# Patient Record
Sex: Female | Born: 1980 | Race: White | Hispanic: No | Marital: Married | Smoking: Never smoker
Health system: Southern US, Community
[De-identification: ages and names within clinical notes are randomized; demographics above are authoritative.]

## PROBLEM LIST (undated history)

## (undated) ENCOUNTER — Inpatient Hospital Stay (HOSPITAL_COMMUNITY): Payer: Self-pay

## (undated) DIAGNOSIS — F419 Anxiety disorder, unspecified: Secondary | ICD-10-CM

## (undated) DIAGNOSIS — F32A Depression, unspecified: Secondary | ICD-10-CM

## (undated) DIAGNOSIS — F329 Major depressive disorder, single episode, unspecified: Secondary | ICD-10-CM

## (undated) DIAGNOSIS — R002 Palpitations: Secondary | ICD-10-CM

## (undated) DIAGNOSIS — E039 Hypothyroidism, unspecified: Secondary | ICD-10-CM

## (undated) HISTORY — PX: OTHER SURGICAL HISTORY: SHX169

## (undated) HISTORY — PX: BUNIONECTOMY: SHX129

---

## 2011-09-13 DIAGNOSIS — N979 Female infertility, unspecified: Secondary | ICD-10-CM | POA: Insufficient documentation

## 2014-02-19 ENCOUNTER — Encounter (HOSPITAL_COMMUNITY): Payer: Self-pay | Admitting: Pharmacist

## 2014-03-05 ENCOUNTER — Ambulatory Visit (HOSPITAL_COMMUNITY): Admission: RE | Admit: 2014-03-05 | Payer: Self-pay | Source: Ambulatory Visit | Admitting: Obstetrics and Gynecology

## 2014-03-05 ENCOUNTER — Encounter (HOSPITAL_COMMUNITY): Admission: RE | Payer: Self-pay | Source: Ambulatory Visit

## 2014-03-05 SURGERY — LAPAROSCOPY, DIAGNOSTIC
Anesthesia: General

## 2014-03-27 DIAGNOSIS — Z3183 Encounter for assisted reproductive fertility procedure cycle: Secondary | ICD-10-CM | POA: Insufficient documentation

## 2015-01-29 ENCOUNTER — Ambulatory Visit (INDEPENDENT_AMBULATORY_CARE_PROVIDER_SITE_OTHER): Payer: BLUE CROSS/BLUE SHIELD | Admitting: Podiatry

## 2015-01-29 ENCOUNTER — Telehealth: Payer: Self-pay | Admitting: *Deleted

## 2015-01-29 VITALS — BP 101/47 | HR 75 | Resp 15

## 2015-01-29 DIAGNOSIS — M79671 Pain in right foot: Secondary | ICD-10-CM

## 2015-01-29 DIAGNOSIS — M79672 Pain in left foot: Secondary | ICD-10-CM

## 2015-01-29 DIAGNOSIS — Q6689 Other  specified congenital deformities of feet: Secondary | ICD-10-CM

## 2015-01-29 DIAGNOSIS — M2142 Flat foot [pes planus] (acquired), left foot: Secondary | ICD-10-CM

## 2015-01-29 DIAGNOSIS — M2141 Flat foot [pes planus] (acquired), right foot: Secondary | ICD-10-CM

## 2015-01-29 NOTE — Progress Notes (Signed)
   Subjective:    Patient ID: Cassandra Kennedy, female    DOB: April 06, 1981, 34 y.o.   MRN: [id number]  HPI 34 year old female presents the office they with her husband for surgical consultation due to bilateral flatfoot deformity. She states that she's had flatfeet her entire life and she's had pain to her feet for quite some time. She states that she has pain particularly after prolonged appears ambulation and walking. She states that she has tried over-the-counter and custom orthotics without much relief. Due to continued pain she was referred to me for surgical consultation. Left is slightly worse than the right Denies any history of injury or trauma. Denies any numbness or tingling. No other complaints at this time  Review of Systems  All other systems reviewed and are negative.      Objective:   Physical Exam AAO x3, NAD DP/PT pulses palpable bilaterally, CRT less than 3 seconds Protective sensation intact with Simms Weinstein monofilament, vibratory sensation intact, Achilles tendon reflex intact Nonweightbearing exam reveals that the ankle and subtotal joint range of motion is intact. There is no pain or crepitation with subtalar ankle joint range of motion or along the mid tarsal or MTPJ. There is equinus present. Upon weightbearing evaluation there is a decrease in medial arch height and forefoot adduction and calcaneal valgus. Upon gait evaluation there is excessive pronation without any resupination gait. There is no areas of pinpoint bony tenderness or pain with vibratory sensation. Subjectively there is tenderness on this lateral aspect the left foot over the sinus tarsi more than the right. She is able to perform acetyl and double heel rise without any discomfort. There is no pain on the course of the posterior tibial tendon or along the insertion of the navicular tuberosity. No other areas of tenderness to bilateral lower extremities. MMT 5/5, ROM WNL.  No open lesions or pre-ulcerative  lesions.  No overlying edema, erythema, increase in warmth to bilateral lower extremities.  No pain with calf compression, swelling, warmth, erythema bilaterally.      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  -Treatment options discussed including all alternatives, risks, and complications -I discussed both conservative and surgical options the patient. At this time she has attempted conservative treatment without any resolution she is inquiring about possible surgical intervention. I discussed with her flatfoot reconstruction including medial calcaneal slide osteotomy, Evans calcaneal osteotomy, Cotton osteotomy and heel cord lengthening. She was inquiring about a STJ implant. I discussed the indications for the implant and is typically indicated children although it can be done in adults but I believe the results are not as good.  -I would like to obtain a CT scan without contrast to the left foot to evaluate the integrity of the joints to determine definitve surgical procedures. Also, rule out tarsal coalition.  -Follow-up after CT scan or sooner if any problems are to arise. In the meantime, encouraged to call the office with any questions/concerns/change in symptoms.

## 2015-01-29 NOTE — Telephone Encounter (Signed)
Dr. Ardelle Anton ordered CT Scan of left foot without contrast r/o tarsal coalition vs sub-talar joint arthritis.

## 2015-01-30 ENCOUNTER — Telehealth: Payer: Self-pay | Admitting: *Deleted

## 2015-01-30 ENCOUNTER — Encounter: Payer: Self-pay | Admitting: *Deleted

## 2015-01-30 ENCOUNTER — Other Ambulatory Visit: Payer: Self-pay | Admitting: Podiatry

## 2015-01-30 DIAGNOSIS — M79672 Pain in left foot: Secondary | ICD-10-CM

## 2015-01-30 DIAGNOSIS — Q6689 Other  specified congenital deformities of feet: Secondary | ICD-10-CM

## 2015-01-30 NOTE — Telephone Encounter (Signed)
Cancelled order - CT 3D Recon at [facility] ([id number]).  [facility] states proper order for CT of foot without contrast is [id number], and the receptionist there said she had changed to proper orders.

## 2015-04-09 NOTE — Progress Notes (Signed)
Prior authorization [employer] orders# [id number].

## 2015-06-21 ENCOUNTER — Encounter (HOSPITAL_COMMUNITY): Payer: Self-pay

## 2015-06-21 ENCOUNTER — Emergency Department (HOSPITAL_COMMUNITY)
Admission: EM | Admit: 2015-06-21 | Discharge: 2015-06-21 | Disposition: A | Discharge status: Home / Self Care | Payer: BLUE CROSS/BLUE SHIELD | Attending: Emergency Medicine | Admitting: Emergency Medicine

## 2015-06-21 DIAGNOSIS — Y998 Other external cause status: Secondary | ICD-10-CM | POA: Diagnosis not present

## 2015-06-21 DIAGNOSIS — S51852A Open bite of left forearm, initial encounter: Secondary | ICD-10-CM | POA: Diagnosis present

## 2015-06-21 DIAGNOSIS — Z23 Encounter for immunization: Secondary | ICD-10-CM | POA: Diagnosis not present

## 2015-06-21 DIAGNOSIS — Y9289 Other specified places as the place of occurrence of the external cause: Secondary | ICD-10-CM | POA: Insufficient documentation

## 2015-06-21 DIAGNOSIS — W540XXA Bitten by dog, initial encounter: Secondary | ICD-10-CM | POA: Insufficient documentation

## 2015-06-21 DIAGNOSIS — Y9389 Activity, other specified: Secondary | ICD-10-CM | POA: Diagnosis not present

## 2015-06-21 MED ORDER — AMOXICILLIN-POT CLAVULANATE 875-125 MG PO TABS
1.0000 | ORAL_TABLET | Freq: Once | ORAL | Status: AC
  Administered 2015-06-21: 1 via ORAL
  Filled 2015-06-21: qty 1

## 2015-06-21 MED ORDER — TETANUS-DIPHTH-ACELL PERTUSSIS 5-2.5-18.5 LF-MCG/0.5 IM SUSP
0.5000 mL | Freq: Once | INTRAMUSCULAR | Status: AC
  Administered 2015-06-21: 0.5 mL via INTRAMUSCULAR
  Filled 2015-06-21: qty 0.5

## 2015-06-21 MED ORDER — AMOXICILLIN-POT CLAVULANATE 875-125 MG PO TABS: 1.0000 | ORAL_TABLET | Freq: Two times a day (BID) | ORAL | Status: DC

## 2015-06-21 MED ORDER — FENTANYL CITRATE (PF) 100 MCG/2ML IJ SOLN
100.0000 ug | Freq: Once | INTRAMUSCULAR | Status: AC
  Administered 2015-06-21: 100 ug via INTRAVENOUS
  Filled 2015-06-21: qty 2

## 2015-06-21 MED ORDER — BACITRACIN ZINC 500 UNIT/GM EX OINT
TOPICAL_OINTMENT | CUTANEOUS | Status: AC
  Administered 2015-06-21: 2
  Filled 2015-06-21: qty 1.8

## 2015-06-21 MED ORDER — HYDROMORPHONE HCL 1 MG/ML IJ SOLN
1.0000 mg | Freq: Once | INTRAMUSCULAR | Status: AC
  Administered 2015-06-21: 1 mg via INTRAVENOUS
  Filled 2015-06-21: qty 1

## 2015-06-21 MED ORDER — LIDOCAINE-EPINEPHRINE 2 %-1:100000 IJ SOLN
20.0000 mL | Freq: Once | INTRAMUSCULAR | Status: AC
  Administered 2015-06-21: 20 mL
  Filled 2015-06-21: qty 1

## 2015-06-21 MED ORDER — HYDROCODONE-ACETAMINOPHEN 5-325 MG PO TABS: 2.0000 | ORAL_TABLET | ORAL | Status: DC | PRN

## 2015-06-21 NOTE — ED Provider Notes (Signed)
CSN: [id number]     Arrival date & time 06/21/15  1846 History   First MD Initiated Contact with Patient 06/21/15 1849     Chief Complaint  Patient presents with  . Animal Bite     (Consider location/radiation/quality/duration/timing/severity/associated sxs/prior Treatment) HPI Cassandra Kennedy is a 34 y.o. female comes in for evaluation after an light. Patient is accompanied by husband. They report presently 45 minutes ago they were taking a stray dog too [place] [employer] to see if it had a tracking chip. When patient approach dog it bit her on the left arm. Dog's owner arrived and reports now is up to date on vaccines. Patient denies any numbness or weakness and is able to move all joints and fingers distal to injury. She did not clean or wash injury. Certain hand movements worsen symptoms. She rates pain as severe. She is unsure of last tetanus.  History reviewed. No pertinent past medical history. History reviewed. No pertinent past surgical history. History reviewed. No pertinent family history. Social History  Substance Use Topics  . Smoking status: None  . Smokeless tobacco: None  . Alcohol Use: None   OB History    No data available     Review of Systems A 10 point review of systems was completed and was negative except for pertinent positives and negatives as mentioned in the history of present illness     Allergies  Metronidazole  Home Medications   Prior to Admission medications   Medication Sig Start Date End Date Taking? Authorizing Provider  levothyroxine (SYNTHROID, LEVOTHROID) 100 MCG tablet Take 100 mcg by mouth daily before breakfast.   Yes Historical Provider, MD  amoxicillin-clavulanate (AUGMENTIN) 875-125 MG tablet Take 1 tablet by mouth every 12 (twelve) hours. 06/21/15   Joycie Peek, PA-C  HYDROcodone-acetaminophen (NORCO) 5-325 MG tablet Take 2 tablets by mouth every 4 (four) hours as needed. 06/21/15   Joycie Peek, PA-C   BP 116/71 mmHg  Pulse 111   Resp 20  Wt 118 lb (53.524 kg)  SpO2 100% Physical Exam  Constitutional: She is oriented to person, place, and time. She appears well-developed and well-nourished.  HENT:  Head: Normocephalic and atraumatic.  Mouth/Throat: Oropharynx is clear and moist.  Eyes: Conjunctivae are normal. Pupils are equal, round, and reactive to light. Right eye exhibits no discharge. Left eye exhibits no discharge. No scleral icterus.  Neck: Neck supple.  Cardiovascular: Normal rate, regular rhythm and normal heart sounds.   Pulmonary/Chest: Effort normal and breath sounds normal. No respiratory distress. She has no wheezes. She has no rales.  Abdominal: Soft. There is no tenderness.  Musculoskeletal: She exhibits no tenderness.  Dog bite to dorsum of left mid forearm. Approximately 4 cm in length, oblique. There is exposure of tendon sheath but no apparent tendon sheath penetration. Patient is able to flex and extend all joints distal to the injury against resistance. 2 satellite puncture wounds on the ventral aspect opposite laceration.  Neurological: She is alert and oriented to person, place, and time.  Cranial Nerves II-XII grossly intact. Motor strength is 5/5 in all joints distal to injury. Sensation is intact to light touch.  Skin: Skin is warm and dry. No rash noted.  Psychiatric: She has a normal mood and affect.  Nursing note and vitals reviewed.   ED Course  Procedures (including critical care time) Labs Review Labs Reviewed - No data to display  Imaging Review No results found. I have personally reviewed and evaluated these images and lab  results as part of my medical decision-making.   EKG Interpretation None     LACERATION REPAIR Performed by: Sharlene Mottsartner, Silveria Botz W Authorized by: Sharlene Mottsartner, Jachai Okazaki W Consent: Verbal consent obtained. Risks and benefits: risks, benefits and alternatives were discussed Consent given by: patient Patient identity confirmed: provided demographic  data Prepped and Draped in normal sterile fashion Wound explored  Laceration Location: Left forearm  Laceration Length: 4 cm  No Foreign Bodies seen or palpated  Anesthesia: local infiltration  Local anesthetic: lidocaine 2 % with epinephrine  Anesthetic total: 5 ml  Irrigation method: syringe Amount of cleaning: Copious   Skin closure: Loose closure with 4-0 Prolene   Number of sutures: 3   Technique: Simple interrupted   Patient tolerance: Patient tolerated the procedure well with no immediate complications.  Meds given in ED:  Medications  fentaNYL (SUBLIMAZE) injection 100 mcg (100 mcg Intravenous Given 06/21/15 1920)  Tdap (BOOSTRIX) injection 0.5 mL (0.5 mLs Intramuscular Given 06/21/15 1941)  HYDROmorphone (DILAUDID) injection 1 mg (1 mg Intravenous Given 06/21/15 2001)  lidocaine-EPINEPHrine (XYLOCAINE W/EPI) 2 %-1:100000 (with pres) injection 20 mL (20 mLs Infiltration Given 06/21/15 2215)  amoxicillin-clavulanate (AUGMENTIN) 875-125 MG per tablet 1 tablet (1 tablet Oral Given 06/21/15 2051)  HYDROmorphone (DILAUDID) injection 1 mg (1 mg Intravenous Given 06/21/15 2052)  bacitracin 500 UNIT/GM ointment (2 application  Given 06/21/15 2215)  HYDROmorphone (DILAUDID) injection 1 mg (1 mg Intravenous Given 06/21/15 2219)    Discharge Medication List as of 06/21/2015 10:02 PM    START taking these medications   Details  amoxicillin-clavulanate (AUGMENTIN) 875-125 MG tablet Take 1 tablet by mouth every 12 (twelve) hours., Starting 06/21/2015, Until Discontinued, Print    HYDROcodone-acetaminophen (NORCO) 5-325 MG tablet Take 2 tablets by mouth every 4 (four) hours as needed., Starting 06/21/2015, Until Discontinued, Print       Filed Vitals:   06/21/15 1850 06/21/15 1859 06/21/15 2217  BP:  133/93 116/71  Pulse:  102 111  Resp:  20 20  Weight:  118 lb (53.524 kg)   SpO2: 100% 100% 100%    MDM  Cassandra Kennedy is a 34 y.o. female with no significant past medical  history comes in for evaluation after dog bite. The dog is known and reportedly up-to-date on vaccinations. Patient's tetanus was updated in the ED. Given Augmentin in the ED. After copious irrigation, Laceration was loosely closed due to gaping appearance. Discussed patient will need to return in 48 hours for wound recheck, sooner for other concerning symptoms including fever, increased redness or swelling, increased pain or purulent discharge, decreased range of motion. Discharged home with short course pain medicines, Augmentin, dressing materials. She remains hemodynamically stable. Suspect tachycardia due to pain. Prior to patient discharge, I discussed and reviewed this case with Dr. Adriana Simasook, who also saw and evaluated the patient and agrees with the plan.   Final diagnoses:  Dog bite        Joycie PeekBenjamin Oshua Mcconaha, PA-C 06/22/15 1642  Donnetta HutchingBrian Cook, MD 06/22/15 [phone]

## 2015-06-21 NOTE — Discharge Instructions (Signed)
Please take your antibiotics as prescribed. Change your dressing as needed and keep it clean. Take your pain medicine as needed for severe pain. Do not take this medicine will driving or operating machinery. Return to ED or your PCP in 2 days for wound recheck. Return sooner for worsening symptoms including fevers, chills, increased redness, cloudy drainage from your arm or increased pain with movement.

## 2015-06-21 NOTE — ED Notes (Signed)
Bed: [id number] Expected date:  Expected time:  Means of arrival:  Comments: Dog bite

## 2015-06-21 NOTE — ED Notes (Signed)
BIB EMS. Pt found stray dog and was trying to take it to [place] [employer] to see if it had a chip. When she bet down and approached the dog, she was bitten on her left arm.Per EMS lac on top of left arm, 2 anterior punture wounds. Owners of the dog met with patient and state that the dog's rabies vaccination is UTD. The owners were filing with police when the pt was brought here. 50 fentanyl given on truck.

## 2015-06-23 ENCOUNTER — Emergency Department (HOSPITAL_COMMUNITY)
Admission: EM | Admit: 2015-06-23 | Discharge: 2015-06-23 | Disposition: A | Discharge status: Home / Self Care | Payer: BLUE CROSS/BLUE SHIELD | Attending: Emergency Medicine | Admitting: Emergency Medicine

## 2015-06-23 ENCOUNTER — Encounter (HOSPITAL_COMMUNITY): Payer: Self-pay | Admitting: Emergency Medicine

## 2015-06-23 DIAGNOSIS — Z79899 Other long term (current) drug therapy: Secondary | ICD-10-CM | POA: Diagnosis not present

## 2015-06-23 DIAGNOSIS — Z4801 Encounter for change or removal of surgical wound dressing: Secondary | ICD-10-CM | POA: Diagnosis not present

## 2015-06-23 DIAGNOSIS — Z5189 Encounter for other specified aftercare: Secondary | ICD-10-CM

## 2015-06-23 MED ORDER — BACITRACIN ZINC 500 UNIT/GM EX OINT
TOPICAL_OINTMENT | CUTANEOUS | Status: AC
  Administered 2015-06-23: 1
  Filled 2015-06-23: qty 0.9

## 2015-06-23 NOTE — ED Provider Notes (Signed)
CSN: [id number]     Arrival date & time 06/23/15  1601 History  By signing my name below, I, Cassandra Kennedy, attest that this documentation has been prepared under the direction and in the presence of [employer], PA-C. Electronically Signed: Octavia Kennedy, ED Scribe. 06/23/2015. 6:20 PM.    Chief Complaint  Patient presents with  . Wound Check      The history is provided by the patient. No language interpreter was used.   HPI Comments: Cassandra Kennedy is a 34 y.o. female who presents to the Emergency Department presenting with a wound check. Pt reports she was attacked by a pit bull on Saturday and received a deep laceration and multiple puncture wounds on her left forearm. Patient was seen in the emergency department for initial wound care 2 days ago. 4 sutures were placed on her left forearm. Skin was not approximated as the laceration was caused by a dog bite. Pt was told to return 48 hours after incident to be reevaluated. Pt is currently taking amoxicillin and has been following the correct wound care with changing her dressing. Pt denies fevers and chills, redness, swelling or drainage around wound.   History reviewed. No pertinent past medical history. History reviewed. No pertinent past surgical history. No family history on file. Social History  Substance Use Topics  . Smoking status: Never Smoker   . Smokeless tobacco: None  . Alcohol Use: None   OB History    No data available     Review of Systems  Constitutional: Negative for fever and chills.  Skin: Positive for wound.  All other systems reviewed and are negative.     Allergies  Metronidazole  Home Medications   Prior to Admission medications   Medication Sig Start Date End Date Taking? Authorizing Provider  amoxicillin-clavulanate (AUGMENTIN) 875-125 MG tablet Take 1 tablet by mouth every 12 (twelve) hours. 06/21/15   Joycie Peek, PA-C  HYDROcodone-acetaminophen (NORCO) 5-325 MG tablet Take 2  tablets by mouth every 4 (four) hours as needed. 06/21/15   Joycie Peek, PA-C  levothyroxine (SYNTHROID, LEVOTHROID) 100 MCG tablet Take 100 mcg by mouth daily before breakfast.    Historical Provider, MD   Triage vitals: BP 138/104 mmHg  Pulse 87  Temp(Src) 98.2 F (36.8 C) (Oral)  SpO2 100%  LMP 06/16/2015 Physical Exam  Constitutional: She is oriented to person, place, and time. She appears well-developed and well-nourished. No distress.  HENT:  Head: Normocephalic and atraumatic.  Mouth/Throat: No oropharyngeal exudate.  Eyes: Conjunctivae are normal. Right eye exhibits no discharge. Left eye exhibits no discharge. No scleral icterus.  Cardiovascular: Normal rate and intact distal pulses.   Pulmonary/Chest: Effort normal.  Musculoskeletal: Normal range of motion. She exhibits no edema.  Neurological: She is alert and oriented to person, place, and time. No cranial nerve deficit.  Strength 5/5 throughout. No sensory deficits.    Skin: Skin is warm and dry. No rash noted. She is not diaphoretic. No erythema. No pallor.  4 cm laceration to the dorsal aspect of left mid forearm. 4 sutures in place. Skin not approximated. Multiple additional puncture wounds on the volar aspect of mid forearm. No sutures placed here. No sign of infection. No drainage, redness, swelling. Wounds clean dry and intact.  Patient able to flex and extend all joints distal to the injury. Sensation intact.  Psychiatric: She has a normal mood and affect. Her behavior is normal.  Nursing note and vitals reviewed.   ED Course  Procedures  DIAGNOSTIC STUDIES: Oxygen Saturation is 100% on RA, normal by my interpretation.  COORDINATION OF CARE:  7:10 PM Discussed treatment plan which includes change the dressing with pt at bedside and pt agreed to plan.  Labs Review Labs Reviewed - No data to display  Imaging Review No results found. I have personally reviewed and evaluated these images and lab results  as part of my medical decision-making.   EKG Interpretation None      MDM   Final diagnoses:  Visit for wound check    Healthy 34 year old female presents for wound check. Patient was attacked by a pit bull 2 days ago. Was seen in the emergency department at that time. Patient has large 4 cm laceration to dorsal aspect of mid forearm. 4 sutures in place. Skin not approximated to decrease risk of infection as laceration was caused by dogbite. Dog has had rabies vaccinations. Patient up-to-date on her tetanus. Wounds appear to be healing fine. No sign of infection. Patient is currently taking amoxicillin. Patient is to return in 7 days for suture removal. Discussed with patient to look for signs of infection and return to the emergency department if she experiences any. Wound dressing changed today. Patient stable for discharge. Return precautions outlined in patient discharge instructions. I personally performed the services described in this documentation, which was scribed in my presence. The recorded information has been reviewed and is accurate.    Cassandra KinsmanSamantha Kennedy Hampton ManorDowless, PA-C 06/25/15 1553  Cassandra Boozeavid Glick, MD 06/25/15 [phone]

## 2015-06-23 NOTE — ED Notes (Addendum)
Patient states bitten on left arm Saturday, seen here and received sutures, told to return in 48 hours for wound recheck. Denies any new symptoms. Denies signs of infection. 1000mg  tylenol approximately 1430.

## 2015-06-23 NOTE — Discharge Instructions (Signed)
Wound Care Taking care of your wound properly can help to prevent pain and infection. It can also help your wound to heal more quickly.  HOW TO CARE FOR YOUR WOUND  Take or apply over-the-counter and prescription medicines only as told by your health care provider.  If you were prescribed antibiotic medicine, take or apply it as told by your health care provider. Do not stop using the antibiotic even if your condition improves.  Clean the wound each day or as told by your health care provider.  Wash the wound with mild soap and water.  Rinse the wound with water to remove all soap.  Pat the wound dry with a clean towel. Do not rub it.  There are many different ways to close and cover a wound. For example, a wound can be covered with stitches (sutures), skin glue, or adhesive strips. Follow instructions from your health care provider about:  How to take care of your wound.  When and how you should change your bandage (dressing).  When you should remove your dressing.  Removing whatever was used to close your wound.  Check your wound every day for signs of infection. Watch for:  Redness, swelling, or pain.  Fluid, blood, or pus.  Keep the dressing dry until your health care provider says it can be removed. Do not take baths, swim, use a hot tub, or do anything that would put your wound underwater until your health care provider approves.  Raise (elevate) the injured area above the level of your heart while you are sitting or lying down.  Do not scratch or pick at the wound.  Keep all follow-up visits as told by your health care provider. This is important. SEEK MEDICAL CARE IF:  You received a tetanus shot and you have swelling, severe pain, redness, or bleeding at the injection site.  You have a fever.  Your pain is not controlled with medicine.  You have increased redness, swelling, or pain at the site of your wound.  You have fluid, blood, or pus coming from your  wound.  You notice a bad smell coming from your wound or your dressing. SEEK IMMEDIATE MEDICAL CARE IF:  You have a red streak going away from your wound.   This information is not intended to replace advice given to you by your health care provider. Make sure you discuss any questions you have with your health care provider.   Return to the emergency department in 7 days for suture removal. Return to the emergency department sooner if you experience signs of infection, redness, swelling, drainage from the infected area, fever. Continue taking antibiotics as prescribed. Continue washing affected area with soap and water.

## 2016-07-15 DIAGNOSIS — Q6652 Congenital pes planus, left foot: Secondary | ICD-10-CM | POA: Insufficient documentation

## 2016-07-15 DIAGNOSIS — Q6689 Other  specified congenital deformities of feet: Secondary | ICD-10-CM | POA: Insufficient documentation

## 2016-07-15 DIAGNOSIS — M21862 Other specified acquired deformities of left lower leg: Secondary | ICD-10-CM | POA: Insufficient documentation

## 2016-07-15 DIAGNOSIS — Q6651 Congenital pes planus, right foot: Secondary | ICD-10-CM | POA: Insufficient documentation

## 2016-07-30 ENCOUNTER — Ambulatory Visit (INDEPENDENT_AMBULATORY_CARE_PROVIDER_SITE_OTHER): Payer: BLUE CROSS/BLUE SHIELD | Admitting: Podiatry

## 2016-07-30 ENCOUNTER — Ambulatory Visit (INDEPENDENT_AMBULATORY_CARE_PROVIDER_SITE_OTHER): Payer: BLUE CROSS/BLUE SHIELD

## 2016-07-30 DIAGNOSIS — Q6689 Other  specified congenital deformities of feet: Secondary | ICD-10-CM | POA: Diagnosis not present

## 2016-07-30 DIAGNOSIS — S92911A Unspecified fracture of right toe(s), initial encounter for closed fracture: Secondary | ICD-10-CM

## 2016-07-30 DIAGNOSIS — M79671 Pain in right foot: Secondary | ICD-10-CM

## 2016-08-11 NOTE — Progress Notes (Signed)
Subjective: 35 year old female presents the office they with her husband for concerns of flatfoot deformity. After I last saw her last year she has seen 2 other physicians for this for different opinions. At this point she states that she'll to proceed with surgical invention. She's tried change of shoes, orthotics, bracing without any significant symptoms. She also brought an MRI for review. Denies any systemic complaints such as fevers, chills, nausea, vomiting. No acute changes since last appointment, and no other complaints at this time.   Objective: AAO x3, NAD DP/PT pulses palpable bilaterally, CRT less than 3 seconds There is a decrease in medial arch height upon weightbearing. There is decreased range of motion the subtalar joint and left foot and how there is no pain with ROM. Upon putting her heel in a rectus position, it appears that the her forefoot is in a varus position. There is also similar deformity on the right foot.  No open lesions.  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 She had a previous satisfaction this appears to be doing well.   I discussed both conservative and surgical treatment options with the patient. At this point she wishes to proceed with surgical intervention as she is attended multiple conservative translation and resolution. -I long discussion with the patient and her husband in regards to various treatment options as far surgery. She is a very difficult foot type. I discussed her joint sparing versus arthrodesis procedures. I discussed with him and it discussed with other physicians about various  surgical procedures. I'll see her back next several weeks and we will further discuss this.  -Patient encouraged to call the office with any questions, concerns, change in symptoms.   Ovid CurdMatthew Wagoner, DPM

## 2016-08-18 ENCOUNTER — Telehealth: Payer: Self-pay | Admitting: *Deleted

## 2016-08-18 NOTE — Telephone Encounter (Signed)
Called patient and left a message to see how patient was doing and I stated to call our [place] office. Misty StanleyLisa

## 2016-08-23 ENCOUNTER — Encounter: Payer: Self-pay | Admitting: Podiatry

## 2016-08-23 ENCOUNTER — Ambulatory Visit (INDEPENDENT_AMBULATORY_CARE_PROVIDER_SITE_OTHER): Payer: BLUE CROSS/BLUE SHIELD

## 2016-08-23 ENCOUNTER — Other Ambulatory Visit: Payer: Self-pay | Admitting: Podiatry

## 2016-08-23 ENCOUNTER — Ambulatory Visit (INDEPENDENT_AMBULATORY_CARE_PROVIDER_SITE_OTHER): Payer: BLUE CROSS/BLUE SHIELD | Admitting: Podiatry

## 2016-08-23 DIAGNOSIS — M2141 Flat foot [pes planus] (acquired), right foot: Secondary | ICD-10-CM

## 2016-08-23 DIAGNOSIS — M779 Enthesopathy, unspecified: Secondary | ICD-10-CM

## 2016-08-23 DIAGNOSIS — M21961 Unspecified acquired deformity of right lower leg: Secondary | ICD-10-CM | POA: Diagnosis not present

## 2016-08-23 DIAGNOSIS — Q6689 Other  specified congenital deformities of feet: Secondary | ICD-10-CM | POA: Diagnosis not present

## 2016-08-23 DIAGNOSIS — M2142 Flat foot [pes planus] (acquired), left foot: Secondary | ICD-10-CM | POA: Diagnosis not present

## 2016-08-23 NOTE — Patient Instructions (Signed)

## 2016-08-23 NOTE — Progress Notes (Signed)
Subjective: 36 year old presents the office today for follow-up evaluation of left foot pain. She says right foot is doing well she's having no pain. The previous stress fracture. She also presents a pickup orthotics discussed surgical intervention. She's had pain to her feet for several years at this point. She states that she gets pain to the heels with the arch of the foot and she also gets pain on the sinus tarsi where she points to. She has seen 2 other physicians for this in regards to surgical consultation. She presented to further discuss this as well. She has tried shoe changes, orthotics or any significant improvement. Denies any systemic complaints such as fevers, chills, nausea, vomiting. No acute changes since last appointment, and no other complaints at this time.   Objective: AAO x3, NAD DP/PT pulses palpable bilaterally, CRT less than 3 seconds There is a decrease in medial arch height upon weightbearing. There is prolonged pronation throughout gait and there is no recent supination. There is decreased motion the subtalar joint and there is tenderness in the lateral aspect of the sinus tarsi. There is no pain or resection ankle joint range of motion. Upon weightbearing and I put her heel into a neutral position she has a significant forefoot supination. There is no area pinpoint bony tenderness or pain vibratory sensation. Equinus is present. There is no other areas of tenderness bilaterally. No open lesions or pre-ulcerative lesions. There is no pain with calf compression, swelling, warmth, erythema.  Assessment: 36 year old female with symptomatic flatfoot, left foot tarsal coalition, middle facet of the subtalar joint.  Plan: -Treatment options discussed including all alternatives, risks, and complications -Etiology of symptoms were discussed -X-rays were obtained and reviewed with the patient. I took x-rays today with her foot in a rectus position which reveals the forefoot  supination. There is no evidence of acute fracture. -Orthotics were dispensed today. Oral and written break in instructions were discussed. -I discussed with her both conservative and surgical treatment options. At this time after further evaluation I discussed with her subtalar joint arthrodesis as well as cotton osteotomy. Does not appear that she has any spasticity or any other neuromuscular disorders. Discussed other surgical options including resection of the coalition with reconstruction. Given the decreased range of motion subtalar joint as well as pain in the area at the subtalar joint fusion be more beneficial for her long-term. -I discussed a steroid injection the sinus tarsi today for both diagnostic and therapeutic purposes that she wishes to proceed with this. Under sterile conditions a mixture of Celestone Soluspan as well as local anesthetic was infiltrated into the area of tenderness on the lateral as the foot along the sinus tarsi. Postinjection care was discussed. -I'll follow up with her in 2 weeks or sooner if needed. I encouraged her to call any questions, concerns or any changes symptoms.  Ovid CurdMatthew Wagoner, DPM

## 2016-09-06 ENCOUNTER — Encounter: Payer: Self-pay | Admitting: Podiatry

## 2016-09-06 ENCOUNTER — Ambulatory Visit (INDEPENDENT_AMBULATORY_CARE_PROVIDER_SITE_OTHER): Payer: BLUE CROSS/BLUE SHIELD | Admitting: Podiatry

## 2016-09-06 DIAGNOSIS — M21961 Unspecified acquired deformity of right lower leg: Secondary | ICD-10-CM | POA: Diagnosis not present

## 2016-09-06 DIAGNOSIS — Q6689 Other  specified congenital deformities of feet: Secondary | ICD-10-CM

## 2016-09-06 NOTE — Patient Instructions (Signed)

## 2016-09-06 NOTE — Progress Notes (Signed)
Subjective: 36 year old presents the office today for follow-up evaluation of left foot pain. Shr states that the injection did help. She states that she still gets pain to her feet and she would like to go ahead and proceed with surgical intervention to help with this. This has been ongoing for several years and this has impacted her quality of life and limited her activity. She has tried bracing, multiple orthotics, medications without any relief. She states she knows she is not going to have a perfect foot but would like to decrease her pain and not concerned about how the foot looks cosmetically. Denies any systemic complaints such as fevers, chills, nausea, vomiting. No acute changes since last appointment, and no other complaints at this time.   Objective: AAO x3, NAD DP/PT pulses palpable bilaterally, CRT less than 3 seconds There is a decrease in medial arch height upon weightbearing. There is excess pronation throughout gait and there is no recent supination. There is significantly decreased motion the subtalar joint and there is improved  tenderness in the lateral aspect of the sinus tarsi. There is no pain or resection ankle joint range of motion. Upon weightbearing and I put her heel into a neutral position she has a significant forefoot supination. There is no area pinpoint bony tenderness or pain vibratory sensation. Equinus is present. There is no other areas of tenderness bilaterally. No open lesions or pre-ulcerative lesions. There is no pain with calf compression, swelling, warmth, erythema.  Assessment: 36 year old female with symptomatic flatfoot, left foot tarsal coalition, middle facet of the subtalar joint.  Plan: -Treatment options discussed including all alternatives, risks, and complications -Etiology of symptoms were discussed -Previous x-rays and imaging were reviewed. I discussed both conservative and srugical treatment options. At this time given her continued pain she  would like to discuss surgery. I discussed both joint sparing and joint destructive procedures. I believe at this time a STJ fusion and likely Cotton osteotomy and gastroc recession would be beneficial. I discussed these surgeries to her and her husband in detail. They were aware of the surgery' as well as they have done extensive research on this.  -The incision placement as well as the postoperative course was discussed with the patient. I discussed risks of the surgery which include, but not limited to, infection, bleeding, pain, swelling, need for further surgery, delayed or nonhealing, painful or ugly scar, numbness or sensation changes, over/under correction, recurrence, transfer lesions, further deformity, hardware failure, DVT/PE, loss of toe/foot. Patient understands these risks and wishes to proceed with surgery. The surgical consent was reviewed with the patient all 3 pages were signed. No promises or guarantees were given to the outcome of the procedure. All questions were answered to the best of my ability. Before the surgery the patient was encouraged to call the office if there is any further questions. The surgery will be performed at [facility] and will likely be observation postop.  -Discussed DVT ppx postop and she would like to proceed with this. We will do lovenox -Knee scooter rx provided  Ovid CurdMatthew Wagoner, DPM

## 2016-09-08 ENCOUNTER — Encounter (HOSPITAL_BASED_OUTPATIENT_CLINIC_OR_DEPARTMENT_OTHER): Payer: Self-pay | Admitting: *Deleted

## 2016-09-08 ENCOUNTER — Telehealth: Payer: Self-pay | Admitting: *Deleted

## 2016-09-08 NOTE — Telephone Encounter (Signed)
[facility] since it will have to be admitted to the hospitalist service and I cannot admit myself.

## 2016-09-08 NOTE — Telephone Encounter (Addendum)
"  My wife was there yesterday and saw Dr. Ardelle AntonWagoner.  He told her she needed surgery and that he would do it at [facility] and have her stay over for 24 hours, so they can keep an eye on her.  We received a call from a surgical center today that was not [facility].  So we are calling to see if surgery is supposed to be done there or at [facility]."  Her [facility] is scheduled [facility]  They do have the capability of having a 23 hour stay at [facility].  Let me find out if [facility] is okay or if he wants to do it in the Main OR.  I will call you back.

## 2016-09-09 ENCOUNTER — Telehealth: Payer: Self-pay | Admitting: *Deleted

## 2016-09-09 NOTE — Telephone Encounter (Signed)
"  You left a message for me earlier today saying my surgery has been rescheduled to the hospital which sounds good.  Is it scheduled for the same day?  Do I need to change any preparations as opposed to the hospital and the day surgery center.   Give me a call back."  I called and left her a message that surgery is scheduled for the same day.  Preparation is the same.  You will need to have the history and physical form filled out by your primary care physician and fax it to [phone].  Call if you have any further questions.

## 2016-09-09 NOTE — Telephone Encounter (Signed)
I called and had surgery moved to [facility] per Dr. Ardelle AntonWagoner.  Surgery will start at 12:15 pm and patient will be admitted for observation afterwards.  I left patient a message that the location was changed to hospital and that she will have an overnight stay.

## 2016-09-13 ENCOUNTER — Telehealth: Payer: Self-pay | Admitting: *Deleted

## 2016-09-13 NOTE — Telephone Encounter (Signed)
"  She has a surgery Wednesday with Dr. Ardelle AntonWagoner.  Originally is was scheduled for [facility].  It looks like it has been changed to [facility] as an inpatient surgery.  I want to make sure.  If it is inpatient it requires authorization through [employer] [place].  Please give me a call.

## 2016-09-14 ENCOUNTER — Telehealth: Payer: Self-pay | Admitting: *Deleted

## 2016-09-14 ENCOUNTER — Encounter (HOSPITAL_COMMUNITY): Payer: Self-pay | Admitting: *Deleted

## 2016-09-14 NOTE — Telephone Encounter (Addendum)
Cassandra Kennedy - [facility] states pt's surgery has been changed from outpt surgery center to inpatient hospital surgery and needs Prior Authorization before the surgery. I told Cassandra Kennedy I would give the information to D. Meadows and if she needed help I would assist. Cassandra Kennedy states understanding. 09/30/2016-Pt states she fell today and hit her surgery toe and is in quite a bit of discomfort and has taken 2 of her percocet. I told pt that she should go back to being up on the surgery foot no more than 15 minutes/hour, rest, ice and elevate and I would have a scheduler contact her tomorrow to set up an appt. Pt states understanding and she had called back and set up an appt for tomorrow.

## 2016-09-14 NOTE — Telephone Encounter (Signed)
I'm returning your call regarding this patient's surgery.  She is having a 23 hour observation post-operative.  "They have it in the system as a inpatient admission.  That needs to be changed with the schedulers to outpatient in bed.  Once it is changed to that, it does not need pre-certification."  I will get it switched.  I called and spoke to a [facility] surgery scheduler, Penni BombardKendall and had the information corrected.

## 2016-09-14 NOTE — Progress Notes (Signed)
Pt denies SOB, chest pain, and being under the care of a cardiologist. Pt denies having a stress test, echo and cardiac cath but stated that she wore a Holter Monitor 10 years ago [place] in [place] being evaluated by a cardiologist for heart palpitations. Pt stated " they didn't find anything." Pt stated that an E[facility] may have been [facility]; records requested. Pt made aware to stop taking Aspirin, vitamins, fish oil, Melatonin, and herbal medications. Do not take any NSAIDs ie: Ibuprofen, Advil, Naproxen, BC and Goody Powder or any medication containing Aspirin. Pt verbalized understanding of all pre-op instructions.

## 2016-09-15 ENCOUNTER — Ambulatory Visit (HOSPITAL_COMMUNITY): Payer: BLUE CROSS/BLUE SHIELD

## 2016-09-15 ENCOUNTER — Inpatient Hospital Stay (HOSPITAL_COMMUNITY): Payer: BLUE CROSS/BLUE SHIELD | Admitting: Anesthesiology

## 2016-09-15 ENCOUNTER — Encounter (HOSPITAL_COMMUNITY): Payer: Self-pay | Admitting: *Deleted

## 2016-09-15 ENCOUNTER — Telehealth: Payer: Self-pay | Admitting: *Deleted

## 2016-09-15 ENCOUNTER — Encounter: Payer: Self-pay | Admitting: Podiatry

## 2016-09-15 ENCOUNTER — Encounter (HOSPITAL_COMMUNITY)
Admission: RE | Disposition: A | Discharge status: Home / Self Care | Payer: Self-pay | Source: Ambulatory Visit | Attending: Podiatry

## 2016-09-15 ENCOUNTER — Telehealth: Payer: Self-pay | Admitting: Sports Medicine

## 2016-09-15 ENCOUNTER — Ambulatory Visit (HOSPITAL_COMMUNITY)
Admission: RE | Admit: 2016-09-15 | Discharge: 2016-09-15 | Disposition: A | Discharge status: Home / Self Care | Payer: BLUE CROSS/BLUE SHIELD | Source: Ambulatory Visit | Attending: Podiatry | Admitting: Podiatry

## 2016-09-15 DIAGNOSIS — M2142 Flat foot [pes planus] (acquired), left foot: Secondary | ICD-10-CM | POA: Insufficient documentation

## 2016-09-15 DIAGNOSIS — Z419 Encounter for procedure for purposes other than remedying health state, unspecified: Secondary | ICD-10-CM

## 2016-09-15 DIAGNOSIS — Z79899 Other long term (current) drug therapy: Secondary | ICD-10-CM | POA: Diagnosis not present

## 2016-09-15 DIAGNOSIS — G5752 Tarsal tunnel syndrome, left lower limb: Secondary | ICD-10-CM | POA: Diagnosis not present

## 2016-09-15 DIAGNOSIS — M21172 Varus deformity, not elsewhere classified, left ankle: Secondary | ICD-10-CM | POA: Diagnosis not present

## 2016-09-15 DIAGNOSIS — Q6689 Other  specified congenital deformities of feet: Secondary | ICD-10-CM | POA: Diagnosis not present

## 2016-09-15 DIAGNOSIS — Z9889 Other specified postprocedural states: Secondary | ICD-10-CM

## 2016-09-15 DIAGNOSIS — E039 Hypothyroidism, unspecified: Secondary | ICD-10-CM | POA: Diagnosis not present

## 2016-09-15 DIAGNOSIS — Q660 Congenital talipes equinovarus: Secondary | ICD-10-CM | POA: Diagnosis not present

## 2016-09-15 DIAGNOSIS — M216X2 Other acquired deformities of left foot: Secondary | ICD-10-CM

## 2016-09-15 DIAGNOSIS — Z888 Allergy status to other drugs, medicaments and biological substances status: Secondary | ICD-10-CM | POA: Insufficient documentation

## 2016-09-15 HISTORY — DX: Depression, unspecified: F32.A

## 2016-09-15 HISTORY — DX: Anxiety disorder, unspecified: F41.9

## 2016-09-15 HISTORY — PX: TRIPLE SUBTALAR FUSION: SHX6633

## 2016-09-15 HISTORY — DX: Major depressive disorder, single episode, unspecified: F32.9

## 2016-09-15 HISTORY — DX: Hypothyroidism, unspecified: E03.9

## 2016-09-15 HISTORY — DX: Palpitations: R00.2

## 2016-09-15 LAB — SURGICAL PCR SCREEN
MRSA, PCR: NEGATIVE
Staphylococcus aureus: NEGATIVE

## 2016-09-15 LAB — HCG, SERUM, QUALITATIVE: Preg, Serum: NEGATIVE

## 2016-09-15 LAB — HEMOGLOBIN: Hemoglobin: 14 g/dL (ref 12.0–15.0)

## 2016-09-15 SURGERY — TRIPLE SUBTALAR FUSION
Anesthesia: General | Site: Foot | Laterality: Left

## 2016-09-15 MED ORDER — PHENYLEPHRINE HCL 10 MG/ML IJ SOLN
INTRAMUSCULAR | Status: DC | PRN
  Administered 2016-09-15 (×5): 40 ug via INTRAVENOUS

## 2016-09-15 MED ORDER — BUPIVACAINE-EPINEPHRINE (PF) 0.5% -1:200000 IJ SOLN
INTRAMUSCULAR | Status: DC | PRN
  Administered 2016-09-15: 30 mL via PERINEURAL

## 2016-09-15 MED ORDER — FENTANYL CITRATE (PF) 100 MCG/2ML IJ SOLN
50.0000 ug | INTRAMUSCULAR | Status: DC | PRN
  Filled 2016-09-15: qty 2

## 2016-09-15 MED ORDER — ENOXAPARIN SODIUM 40 MG/0.4ML ~~LOC~~ SOLN: 40.0000 mg | SUBCUTANEOUS | 0 refills | Status: DC

## 2016-09-15 MED ORDER — CEFAZOLIN SODIUM-DEXTROSE 2-4 GM/100ML-% IV SOLN
2.0000 g | INTRAVENOUS | Status: AC
  Administered 2016-09-15: 2 g via INTRAVENOUS
  Filled 2016-09-15: qty 100

## 2016-09-15 MED ORDER — FENTANYL CITRATE (PF) 100 MCG/2ML IJ SOLN
INTRAMUSCULAR | Status: DC | PRN
  Administered 2016-09-15 (×4): 25 ug via INTRAVENOUS

## 2016-09-15 MED ORDER — FENTANYL CITRATE (PF) 100 MCG/2ML IJ SOLN
INTRAMUSCULAR | Status: AC
  Filled 2016-09-15: qty 2

## 2016-09-15 MED ORDER — CHLORHEXIDINE GLUCONATE CLOTH 2 % EX PADS: 6.0000 | MEDICATED_PAD | Freq: Once | CUTANEOUS | Status: DC

## 2016-09-15 MED ORDER — LACTATED RINGERS IV SOLN
INTRAVENOUS | Status: DC
  Administered 2016-09-15 (×2): via INTRAVENOUS

## 2016-09-15 MED ORDER — FENTANYL CITRATE (PF) 100 MCG/2ML IJ SOLN
75.0000 ug | Freq: Once | INTRAMUSCULAR | Status: AC
  Administered 2016-09-15: 75 ug via INTRAVENOUS

## 2016-09-15 MED ORDER — PROMETHAZINE HCL 25 MG/ML IJ SOLN: 6.2500 mg | INTRAMUSCULAR | Status: DC | PRN

## 2016-09-15 MED ORDER — MIDAZOLAM HCL 2 MG/2ML IJ SOLN
1.0000 mg | Freq: Once | INTRAMUSCULAR | Status: AC
  Administered 2016-09-15: 1 mg via INTRAVENOUS

## 2016-09-15 MED ORDER — LIDOCAINE HCL (CARDIAC) 20 MG/ML IV SOLN
INTRAVENOUS | Status: DC | PRN
  Administered 2016-09-15: 80 mg via INTRAVENOUS

## 2016-09-15 MED ORDER — MIDAZOLAM HCL 2 MG/2ML IJ SOLN
INTRAMUSCULAR | Status: AC
  Filled 2016-09-15: qty 2

## 2016-09-15 MED ORDER — LIDOCAINE-EPINEPHRINE (PF) 1 %-1:200000 IJ SOLN
INTRAMUSCULAR | Status: AC
  Filled 2016-09-15: qty 30

## 2016-09-15 MED ORDER — MIDAZOLAM HCL 2 MG/2ML IJ SOLN
1.0000 mg | INTRAMUSCULAR | Status: DC | PRN
  Filled 2016-09-15: qty 2

## 2016-09-15 MED ORDER — ONDANSETRON HCL 4 MG/2ML IJ SOLN
INTRAMUSCULAR | Status: DC | PRN
  Administered 2016-09-15: 4 mg via INTRAVENOUS

## 2016-09-15 MED ORDER — EPHEDRINE SULFATE 50 MG/ML IJ SOLN
INTRAMUSCULAR | Status: DC | PRN
Start: 2016-09-15 — End: 2016-09-15
  Administered 2016-09-15: 2.5 mg via INTRAVENOUS
  Administered 2016-09-15: 5 mg via INTRAVENOUS
  Administered 2016-09-15: 2.5 mg via INTRAVENOUS

## 2016-09-15 MED ORDER — HYDROMORPHONE HCL 1 MG/ML IJ SOLN
0.2500 mg | INTRAMUSCULAR | Status: DC | PRN
  Administered 2016-09-15 (×2): 0.5 mg via INTRAVENOUS

## 2016-09-15 MED ORDER — MIDAZOLAM HCL 2 MG/2ML IJ SOLN
INTRAMUSCULAR | Status: DC | PRN
  Administered 2016-09-15: 2 mg via INTRAVENOUS

## 2016-09-15 MED ORDER — PROMETHAZINE HCL 25 MG PO TABS: 25.0000 mg | ORAL_TABLET | Freq: Three times a day (TID) | ORAL | 0 refills | Status: DC | PRN

## 2016-09-15 MED ORDER — HYDROMORPHONE HCL 1 MG/ML IJ SOLN
INTRAMUSCULAR | Status: DC
Start: 2016-09-15 — End: 2016-09-15
  Filled 2016-09-15: qty 1

## 2016-09-15 MED ORDER — CEPHALEXIN 500 MG PO CAPS: 500.0000 mg | ORAL_CAPSULE | Freq: Three times a day (TID) | ORAL | 0 refills | Status: DC

## 2016-09-15 MED ORDER — OXYCODONE-ACETAMINOPHEN 5-325 MG PO TABS
1.0000 | ORAL_TABLET | Freq: Once | ORAL | Status: AC
Start: 2016-09-15 — End: 2016-09-15
  Administered 2016-09-15: 1 via ORAL

## 2016-09-15 MED ORDER — LIDOCAINE-EPINEPHRINE (PF) 1 %-1:200000 IJ SOLN
INTRAMUSCULAR | Status: DC | PRN
  Administered 2016-09-15: 30 mL

## 2016-09-15 MED ORDER — PROPOFOL 10 MG/ML IV BOLUS
INTRAVENOUS | Status: DC | PRN
  Administered 2016-09-15: 30 mg via INTRAVENOUS
  Administered 2016-09-15: 160 mg via INTRAVENOUS
  Administered 2016-09-15: 40 mg via INTRAVENOUS

## 2016-09-15 MED ORDER — 0.9 % SODIUM CHLORIDE (POUR BTL) OPTIME
TOPICAL | Status: DC | PRN
  Administered 2016-09-15: 1000 mL

## 2016-09-15 MED ORDER — SCOPOLAMINE 1 MG/3DAYS TD PT72
1.0000 | MEDICATED_PATCH | Freq: Once | TRANSDERMAL | Status: DC | PRN
  Administered 2016-09-15: 1.5 mg via TRANSDERMAL
  Filled 2016-09-15: qty 1

## 2016-09-15 MED ORDER — OXYCODONE-ACETAMINOPHEN 5-325 MG PO TABS
ORAL_TABLET | ORAL | Status: DC
Start: 2016-09-15 — End: 2016-09-15
  Filled 2016-09-15: qty 1

## 2016-09-15 MED ORDER — OXYCODONE-ACETAMINOPHEN 5-325 MG PO TABS: 1.0000 | ORAL_TABLET | Freq: Four times a day (QID) | ORAL | 0 refills | Status: DC | PRN

## 2016-09-15 SURGICAL SUPPLY — 52 items
BANDAGE ACE 4X5 VEL STRL LF (GAUZE/BANDAGES/DRESSINGS) ×9 IMPLANT
BANDAGE ESMARK 6X9 LF (GAUZE/BANDAGES/DRESSINGS) ×1 IMPLANT
BIT DRILL LONG ACUTRAK2 7.5 (BIT) ×1 IMPLANT
BLADE LONG MED 31MMX9MM (MISCELLANEOUS) ×1
BLADE LONG MED 31X9 (MISCELLANEOUS) ×2 IMPLANT
BLADE SURG 15 STRL LF DISP TIS (BLADE) ×2 IMPLANT
BLADE SURG 15 STRL SS (BLADE) ×4
BNDG ESMARK 6X9 LF (GAUZE/BANDAGES/DRESSINGS) ×3
BNDG GAUZE ELAST 4 BULKY (GAUZE/BANDAGES/DRESSINGS) ×6 IMPLANT
CHLORAPREP W/TINT 26ML (MISCELLANEOUS) ×3 IMPLANT
COVER MAYO STAND STRL (DRAPES) ×3 IMPLANT
COVER SURGICAL LIGHT HANDLE (MISCELLANEOUS) ×3 IMPLANT
CUFF TOURNIQUET SINGLE 24IN (TOURNIQUET CUFF) ×3 IMPLANT
DRAPE C-ARM 42X72 X-RAY (DRAPES) ×3 IMPLANT
DRAPE C-ARMOR (DRAPES) ×3 IMPLANT
DRAPE OEC MINIVIEW 54X84 (DRAPES) IMPLANT
DRILL LONG ACUTRAK2 7.5 (BIT) ×3
DRSG EMULSION OIL 3X3 NADH (GAUZE/BANDAGES/DRESSINGS) ×3 IMPLANT
ELECT REM PT RETURN 9FT ADLT (ELECTROSURGICAL) ×3
ELECTRODE REM PT RTRN 9FT ADLT (ELECTROSURGICAL) ×1 IMPLANT
GLOVE BIO SURGEON STRL SZ 6.5 (GLOVE) ×2 IMPLANT
GLOVE BIO SURGEON STRL SZ7.5 (GLOVE) ×6 IMPLANT
GLOVE BIO SURGEONS STRL SZ 6.5 (GLOVE) ×1
GLOVE BIOGEL PI IND STRL 7.5 (GLOVE) ×1 IMPLANT
GLOVE BIOGEL PI INDICATOR 7.5 (GLOVE) ×2
GLOVE SURG SS PI 7.0 STRL IVOR (GLOVE) ×6 IMPLANT
GOWN STRL REUS W/ TWL LRG LVL3 (GOWN DISPOSABLE) ×1 IMPLANT
GOWN STRL REUS W/ TWL XL LVL3 (GOWN DISPOSABLE) ×3 IMPLANT
GOWN STRL REUS W/TWL LRG LVL3 (GOWN DISPOSABLE) ×2
GOWN STRL REUS W/TWL XL LVL3 (GOWN DISPOSABLE) ×6
GUIDEWIRE ORTHO .094INX9.25IN (WIRE) ×3 IMPLANT
GUIDEWIRE ORTHO.062IN X 9.25IN (WIRE) ×6 IMPLANT
KIT BASIN OR (CUSTOM PROCEDURE TRAY) ×3 IMPLANT
NEEDLE HYPO 25X1 1.5 SAFETY (NEEDLE) ×3 IMPLANT
NS IRRIG 1000ML POUR BTL (IV SOLUTION) IMPLANT
PACK ORTHO EXTREMITY (CUSTOM PROCEDURE TRAY) ×3 IMPLANT
PUTTY BONE DBX 2.5 MIS (Bone Implant) ×3 IMPLANT
PUTTY BONE DBX 5CC MIX (Putty) ×3 IMPLANT
SCREW ACUTRAK2  7.5 65.0MM (Screw) ×2 IMPLANT
SCREW ACUTRAK2 7.5 65.0MM (Screw) ×1 IMPLANT
SPLINT FIBERGLASS 4X30 (CAST SUPPLIES) ×3 IMPLANT
SPONGE GAUZE 4X4 12PLY STER LF (GAUZE/BANDAGES/DRESSINGS) ×3 IMPLANT
STAPLER VISISTAT 35W (STAPLE) ×3 IMPLANT
SUT ETHILON 4 0 PS 2 18 (SUTURE) ×12 IMPLANT
SUT MNCRL AB 3-0 PS2 18 (SUTURE) ×9 IMPLANT
SUT MNCRL AB 4-0 PS2 18 (SUTURE) IMPLANT
SUT MON AB 5-0 PS2 18 (SUTURE) IMPLANT
SUT VIC AB 2-0 SH 27 (SUTURE) ×6
SUT VIC AB 2-0 SH 27XBRD (SUTURE) ×3 IMPLANT
TOWEL OR 17X26 10 PK STRL BLUE (TOWEL DISPOSABLE) ×3 IMPLANT
UNDERPAD 30X30 (UNDERPADS AND DIAPERS) ×3 IMPLANT
WEDGE COTTON AO 16X6.5 15DEG (Miscellaneous) ×3 IMPLANT

## 2016-09-15 NOTE — Anesthesia Preprocedure Evaluation (Addendum)
Anesthesia Evaluation  Patient identified by MRN, date of birth, ID band Patient awake    Reviewed: Allergy & Precautions, NPO status , Patient's Chart, lab work & pertinent test results  Airway Mallampati: I  TM Distance: >3 FB Neck ROM: Full    Dental  (+) Teeth Intact   Pulmonary neg pulmonary ROS,    breath sounds clear to auscultation       Cardiovascular negative cardio ROS   Rhythm:Regular Rate:Normal     Neuro/Psych negative neurological ROS  negative psych ROS   GI/Hepatic negative GI ROS, Neg liver ROS,   Endo/Other  negative endocrine ROS  Renal/GU negative Renal ROS  negative genitourinary   Musculoskeletal negative musculoskeletal ROS (+)   Abdominal   Peds negative pediatric ROS (+)  Hematology negative hematology ROS (+)   Anesthesia Other Findings   Reproductive/Obstetrics negative OB ROS                             Anesthesia Physical Anesthesia Plan  ASA: I  Anesthesia Plan: General   Post-op Pain Management:  Regional for Post-op pain   Induction: Intravenous  Airway Management Planned: LMA  Additional Equipment:   Intra-op Plan:   Post-operative Plan: Extubation in OR  Informed Consent: I have reviewed the patients History and Physical, chart, labs and discussed the procedure including the risks, benefits and alternatives for the proposed anesthesia with the patient or authorized representative who has indicated his/her understanding and acceptance.   Dental advisory given  Plan Discussed with: CRNA  Anesthesia Plan Comments: (Discussed popliteal block c patient)       Anesthesia Quick Evaluation

## 2016-09-15 NOTE — Progress Notes (Signed)
Orthopedic Tech Progress Note Patient Details:  Anthonette Legatonna Clouse [birth year] [id number]  Ortho Devices Type of Ortho Device: Crutches Ortho Device/Splint Interventions: Ordered, Adjustment   Jennye MoccasinHughes, Gracie Gupta Craig 09/15/2016, 5:06 PM

## 2016-09-15 NOTE — Brief Op Note (Addendum)
09/15/2016  4:03 PM  PATIENT:  Anthonette LegatoAnna Utsey  36 y.o. female  PRE-OPERATIVE DIAGNOSIS:  tarsal coalition, pesplanus congenital, gastroc equinus deformity  POST-OPERATIVE DIAGNOSIS:  tarsal coalition, congenital pesplanus congenital, gastroc equinus,    PROCEDURE:  Procedure(s): SUBTALAR FUSION, COTTON OSTEOTOMY, GASTROC NEMIUS RECESS (Left)  SURGEON:  Surgeon(s) and Role:    * Vivi BarrackMatthew R Wagoner, DPM - Primary  PHYSICIAN ASSISTANT:   ASSISTANTS: none   ANESTHESIA:   none  EBL:  Total I/O In: 1500 [I.V.:1500] Out: 15 [Blood:15]  BLOOD ADMINISTERED:none  DRAINS: none   LOCAL MEDICATIONS USED:  OTHER 8 cc 1% lidocaine with epi  SPECIMEN:  No Specimen  DISPOSITION OF SPECIMEN:  N/A  COUNTS:  YES  TOURNIQUET:   Total Tourniquet Time Documented: Thigh (Left) - 121 minutes Total: Thigh (Left) - 121 minutes   DICTATION: .Reubin Milanragon Dictation  PLAN OF CARE: Discharge home  PATIENT DISPOSITION:  PACU - hemodynamically stable.   Delay start of Pharmacological VTE agent (>24hrs) due to surgical blood loss or risk of bleeding: no

## 2016-09-15 NOTE — Discharge Instructions (Signed)
Resume all medications as before surgery Start percocet 5/325 for pain.

## 2016-09-15 NOTE — Telephone Encounter (Signed)
Patient's husband called stating wife in pain. Reports that Dr. Logan BoresEvans just spoke with them and I informed them that call was just switched back to me. I advised to take 2 percocet and may also take motrin in between doses of pain medication. Recommended patient to continue to ice and elevate and to call back if there are any more issues. Husband thank me for callback. -Dr. Marylene LandStover

## 2016-09-15 NOTE — Transfer of Care (Signed)
Immediate Anesthesia Transfer of Care Note  Patient: Cassandra Kennedy  Procedure(s) Performed: Procedure(s): SUBTALAR FUSION, COTTON OSTEOTOMY, GASTROC NEMIUS RECESS (Left)  Patient Location: PACU  Anesthesia Type:General  Level of Consciousness: awake, oriented and patient cooperative  Airway & Oxygen Therapy: Patient Spontanous Breathing and Patient connected to nasal cannula oxygen  Post-op Assessment: Report given to RN and Post -op Vital signs reviewed and stable  Post vital signs: Reviewed  Last Vitals:  Vitals:   09/15/16 1213 09/15/16 1610  BP: 113/60   Pulse: 96   Resp: 20   Temp:  (P) 37 C    Last Pain:  Vitals:   09/15/16 1210  TempSrc:   PainSc: 0-No pain      Patients Stated Pain Goal: 5 (09/15/16 1022)  Complications: No apparent anesthesia complications

## 2016-09-15 NOTE — H&P (Signed)
Subjective: [name] presents to the hospital today for surgical correction of left foot deformity to help reduce her pain. She has had chronic foot pain for several years and she has seen several physicians for this. She has attempted multiple conservative treatments including shoe changes, orthotics without any relief. She did have a STJ injection for both diagnostic and therapeutic purposes which did help with her pain. At this time she is requesting surgical intervention to help decrease her pain and deformity.  Denies any systemic complaints such as fevers, chills, nausea, vomiting. No acute changes since last appointment, and no other complaints at this time.   Objective: AAO x3, NAD DP/PT pulses palpable bilaterally, CRT less than 3 seconds There is a substantial decrease in medial arch height upon weightbearing. There is restriction of STJ ROM. There is tenderness along the sinus tarsi. Equinus is present as well. Upon placing her heel in a neutral position there is significant supination of the forefoot. No open lesions or pre-ulcerative lesions. Calcaneal valgus is present. There are no other areas of tenderness to the left foot at this time. She has similar deformity to her right foot.  No pain with calf compression, swelling, warmth, erythema  Assessment: Left painful flatfoot, middle facet coalition of the STJ  Plan: -All treatment options discussed with the patient including all alternatives, risks, complications.  -Surgical consent was reviewed with the patient. I again discussed the surgery as well as the post-operative course. I discussed all alternatives, risks, complications with her. She wishes to proceed. She has no further questions. Surgical consent was verified and signed.  -NPO since midnight -Leg block per anesthesia -Will likely admit her postop for 23 hour observation.   Ovid CurdMatthew Wagoner, DPM

## 2016-09-15 NOTE — Anesthesia Procedure Notes (Addendum)
Anesthesia Regional Block:  Popliteal block  Pre-Anesthetic Checklist: ,, timeout performed, Correct Patient, Correct Site, Correct Laterality, Correct Procedure, Correct Position, site marked, Risks and benefits discussed,  Surgical consent,  Pre-op evaluation,  At surgeon's request and post-op pain management  Laterality: Left and Lower  Prep: chloraprep       Needles:   Needle Type: Echogenic Stimulator Needle     Needle Length: 9cm 9 cm Needle Gauge: 21 and 21 G  Needle insertion depth: 4 cm   Additional Needles:  Procedures: ultrasound guided (picture in chart) Popliteal block Narrative:  Start time: 09/15/2016 11:55 AM End time: 09/15/2016 12:05 PM Injection made incrementally with aspirations every 5 mL.  Performed by: Personally  Anesthesiologist: Dezyre Hoefer

## 2016-09-15 NOTE — Telephone Encounter (Signed)
"  Calling to see if Dr. Ardelle AntonWagoner can put orders in for [name].  She's scheduled for surgery tomorrow, [facility] at [facility]."

## 2016-09-15 NOTE — Anesthesia Procedure Notes (Signed)
Procedure Name: LMA Insertion Date/Time: 09/15/2016 1:18 PM Performed by: Daiva EvesAVENEL, Drayden Lukas W Pre-anesthesia Checklist: Patient identified, Emergency Drugs available, Suction available, Patient being monitored and Timeout performed Patient Re-evaluated:Patient Re-evaluated prior to inductionOxygen Delivery Method: Circle system utilized Preoxygenation: Pre-oxygenation with 100% oxygen Intubation Type: IV induction Ventilation: Mask ventilation without difficulty LMA: LMA inserted LMA Size: 4.0 Number of attempts: 1 Placement Confirmation: positive ETCO2,  CO2 detector and breath sounds checked- equal and bilateral Tube secured with: Tape Dental Injury: Teeth and Oropharynx as per pre-operative assessment

## 2016-09-16 NOTE — Discharge Summary (Signed)
Cassandra Kennedy presented to the hospital for surgical correction of left flatfoot deformity due to tarsal coalition. She underwent surgery and she called the procedure well. See operative note. Upon discussion with her as well as her husband she elected to go home and not stay for overnight observation. She was discharged home with pain medication, Phenergan, Keflex. Also discussed Lovenox for DVT prophylaxis for 2 weeks and she was given a prescription for this as well. If they have any table doing the injections she can come by the office on Thursday for teaching.

## 2016-09-16 NOTE — Anesthesia Postprocedure Evaluation (Addendum)
Anesthesia Post Note  Patient: Cassandra Kennedy  Procedure(s) Performed: Procedure(s) (LRB): SUBTALAR FUSION, COTTON OSTEOTOMY, GASTROC NEMIUS RECESS (Left)  Patient location during evaluation: PACU Anesthesia Type: General and Regional Level of consciousness: awake and alert Pain management: pain level controlled Vital Signs Assessment: post-procedure vital signs reviewed and stable Respiratory status: spontaneous breathing, nonlabored ventilation, respiratory function stable and patient connected to nasal cannula oxygen Cardiovascular status: blood pressure returned to baseline and stable Postop Assessment: no signs of nausea or vomiting Anesthetic complications: no       Last Vitals:  Vitals:   09/15/16 1640 09/15/16 1710  BP: 106/60 107/66  Pulse: (!) 110 (!) 113  Resp: (!) 21 16  Temp: 36.7 C     Last Pain:  Vitals:   09/15/16 1710  TempSrc:   PainSc: 4                  Naseem Adler,JAMES TERRILL

## 2016-09-16 NOTE — Op Note (Signed)
PATIENT:  Cassandra Kennedy  36 y.o. female  PRE-OPERATIVE DIAGNOSIS:  tarsal coalition, pesplanus congenital, gastroc equinus deformity  POST-OPERATIVE DIAGNOSIS:  tarsal coalition, congenital pesplanus congenital, gastroc equinus,    PROCEDURE:  Procedure(s): SUBTALAR FUSION, COTTON OSTEOTOMY, GASTROC NEMIUS RECESS (Left)  SURGEON:  Surgeon(s) and Role:    * Vivi BarrackMatthew R Wagoner, DPM - Primary  PHYSICIAN ASSISTANT:   ASSISTANTS: none   ANESTHESIA:   none  EBL:  Total I/O In: 1500 [I.V.:1500] Out: 15 [Blood:15]  BLOOD ADMINISTERED:none  DRAINS: none   LOCAL MEDICATIONS USED:  OTHER 8 cc 1% lidocaine with epi  SPECIMEN:  No Specimen  DISPOSITION OF SPECIMEN:  N/A  COUNTS:  YES  TOURNIQUET:   Total Tourniquet Time Documented: Thigh (Left) - 121 minutes Total: Thigh (Left) - 121 minutes   DICTATION: .Reubin Milanragon Dictation  PLAN OF CARE: Discharge home  PATIENT DISPOSITION:  PACU - hemodynamically stable.   Delay start of Pharmacological VTE agent (>24hrs) due to surgical blood loss or risk of bleeding: no  Indications for surgery: Cassandra Kennedy presents to the office with concerns of a painful left foot. 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 She did have imaging performed which revealed middle facet coalition. She sought out multiple opinions for this surgery. Given the coalition as well as the restrictions subtalar joint range of motion I recommended a subtalar joint fusion as well as a cotton osteotomy due to the forefoot supination. Discussed with gastrocnemius recession. After long discussions with the patient and her husband on multiple occasions the patient as well as the proceed with surgery. She is attended multiple conservative treatments including shoe gear modifications, orthotics, injections the any significant long-term resolution of symptoms. I discussed alternatives, risks, complications with the  patient in detail. No promises or guarantees were given as the outcome of surgery. All questions were answered to the best of my ability.  Procedure in detail: The patient was both verbally and visually identified by myself, the nursing staff, and the anesthesia staff in the preoperative holding area in the preoperative area she had a nerve block performed with anesthesia staff. She was then transferred to the operating room via stretcher and placed on the operative table in a supine position. A thigh tourniquet was placed in the left lower extremity. The left lower extremities and scrubbed prepped and draped in normal sterile fashion.  Procedure #1: Gastrocnemius recession Attention instructed the posterior medial aspect of the calf along the gastrocnemius aponeurosis. A linear incision was made with a #15 with scalpel of the epidermis and dermis. The subcutaneous tissues tissue was then bluntly dissected miniature retract all vital neurovascular structures. An incision was made along the sheath covering aponeurosis of the gastrocnemius. The muscle was identified today for the incision was carried more distally to identify the aponeurosis. Once this was identified reflecting all neurovascular structures a transverse incision was made. The foot was dorsiflexed the site of the adequate range of motion in dorsiflexion. The incision was currently irrigated with sterile saline and hemostasis was achieved. The deep structures were closed with 2-0 Vicryl subcutaneous tissues with 3-0 Monocryl the skin was then closed with 4-0 nylon as well as skin staples.  Procedure #2: Subtalar joint fusion The left lower extremity with an x-ray limited with an Esmarch bandage and thigh tourniquet was inflated to 350 mmHg.  Attention was directed to the lateral aspect of the patient's left foot in which an incision was made just distal/posterior to  the lateral malleolus to the fourth metatarsal base. Incision was made with a  #15 with scalpel of the epidermis the dermis. The subcutaneous tissues were then bluntly and sharply dissected may consider retract all vital neurovascular structures. All bleeders were cauterized as necessary. Next the peroneal tendon sheath was then incised and the tendons were then reflected inferiorly. Next the subtalar joint was identified and a deep incision was then made along the joint. At this time upon identification of the joint there was found to be no motion across the joint and the joint appeared to be fused. Utilizing fluoroscopy to confirm the joint placement. Osteotome was utilized to open up the subtalar joint. Also a sagittal saw was also utilized and care was taken not to penetrate the medial cortex. At this time the subtalar joint was freed to allow for motion. The coalition is identified and this was also resected. At this time the subtalar joint was then prepped utilizing curettes, osteotomes to remove the cartilage and subchondral plate. Once all cartilage was removed a large K wire was utilized for subchondral drilling. Next the heel was placed into a rectus position and utilizing the guidewire for the AccuMed screw this is placed in the inferior portion of the calcaneus into the talus under fluoroscopy guidance. Once the appropriate position a 7.5 mm screw was inserted over the guidewire to appropriate compression. Fluoroscopy utilized confirm placement of the screw and it was across the posterior facet of the subtalar joint there was not penetrating ankle joint. At this time there was a medial gap present along the arthrodesis site. A mixture of DBM bone putty/chips was utilized to fill the void. Prior to this incision was covered with the irrigated with sterile saline. At this time the incision was closed in layered fashion. The deep structures were closed with 2 Vicryl the peroneal sheath was covered 3-0 Monocryl as well as the subcutaneous tissue. The skin was then closed with 4-0 nylon  as well as skin staples. The incision site for the screw in the posterior heel was also closed with nylon.  Procedure #3: Cotton osteotomy Once the heel was placed into a rectus position there was found to be significant forefoot supination. Because of this I elected to proceed with a cotton osteotomy. Utilizing fluoroscopy guidance the medial cuneiform was identified. A linear incision is made along the medial cuneiform with a #15 with scalpel to the epidermis the dermis. The subcutaneous tissues were then bluntly instructed dissected may consider retract all vital neurovascular structures and all bleeders were cauterized as necessary. Next the deep incision was made adjacent to the tendon and all soft tissue and periosteal structures were freed the medial cuneiform. The first metatarsal cuneiform joint was identified. Next a transverse osteotomy was created with a sagittal saw. Next trial sizers for the Cotton was utilized. I decided to [place] a 6.76mm wedge. Fluoroscopy was utilized to confirm adequate reduction with the trial sizer placed. Next an Additive 6.5 mm wedge was inserted into the medial cuneiform. Fluoroscopy utilized to confirm placement. Upon evaluation this did appear to be sitting within the central portion of the medial cuneiform. It did not appear to be penetrating into the middle or to the second metatarsal. The incision was covered with the irrigated with sterile saline and hemostasis was achieved. The incision was closed in layered fashion with the deep structures with 2-0 Vicryl subcutaneous tissues with 3-0 Monocryl and skin was then closed with 4-0 nylon as well as skin staples.  At this time final fluoroscopy shots were obtained which reveal all hardware is in the appropriate position. Upon clinical evaluation the foot was in a rectus position.   Adaptic as well as dry sterile dressing was placed over the incisions. The tourniquet was then released. There was found to be an immediate  capillary refill time to all the digits. Next a well-padded below-knee posterior splint was applied. She was then will come anesthesia and found to have tolerated the procedure well any complications. She was transferred to PACU with vital signs stable and vascular status intact.  I discussed overnight observation stay but she wishes to go home.

## 2016-09-17 ENCOUNTER — Encounter (HOSPITAL_COMMUNITY): Payer: Self-pay | Admitting: Podiatry

## 2016-09-17 ENCOUNTER — Other Ambulatory Visit: Payer: Self-pay | Admitting: Podiatry

## 2016-09-17 ENCOUNTER — Telehealth: Payer: Self-pay | Admitting: *Deleted

## 2016-09-17 MED ORDER — OXYCODONE-ACETAMINOPHEN 5-325 MG PO TABS: 1.0000 | ORAL_TABLET | Freq: Four times a day (QID) | ORAL | 0 refills | Status: DC | PRN

## 2016-09-17 NOTE — Telephone Encounter (Signed)
Please refill- How is she doing otherwise?

## 2016-09-17 NOTE — Telephone Encounter (Signed)
Pt states she is still having pain for 09/15/2016 surgery. Dr. Bary CastillaWagoner okayed refill as previous. Informed pt the rx would be at the [place].

## 2016-09-17 NOTE — Telephone Encounter (Signed)
CALLED PATIENT AND LEFT A MESSAGE STATING THAT I WAS CALLING TO CHECK ON PATIENT AND TO CALL ME BACK IN THE [place] OFFICE IF ANY CONCERNS OR QUESTIONS.  Cassandra Kennedy

## 2016-09-17 NOTE — Telephone Encounter (Signed)
Pt called and needs a refill on her pain medication.

## 2016-09-18 ENCOUNTER — Encounter: Payer: Self-pay | Admitting: Sports Medicine

## 2016-09-18 NOTE — Postoperative (Signed)
Patients husband called stating that wife is having pain and has taken the maximum on her Percocet and Tylenol and wife is having pain that seems to be getting worse over the last day. I advised husband to loosen ACE wrap to see if this would help to alleviate symptoms and to continue with ice and elevation and may resume pain meds safely after 4-6 hours have passed. Husband expressed understanding and I encouraged him to call back if t here are any issues.  Dr. Marylene LandStover

## 2016-09-20 ENCOUNTER — Ambulatory Visit (INDEPENDENT_AMBULATORY_CARE_PROVIDER_SITE_OTHER): Payer: Self-pay | Admitting: Podiatry

## 2016-09-20 ENCOUNTER — Ambulatory Visit (INDEPENDENT_AMBULATORY_CARE_PROVIDER_SITE_OTHER): Payer: BLUE CROSS/BLUE SHIELD

## 2016-09-20 DIAGNOSIS — Q6689 Other  specified congenital deformities of feet: Secondary | ICD-10-CM | POA: Diagnosis not present

## 2016-09-20 DIAGNOSIS — Z9889 Other specified postprocedural states: Secondary | ICD-10-CM

## 2016-09-20 DIAGNOSIS — M2142 Flat foot [pes planus] (acquired), left foot: Secondary | ICD-10-CM

## 2016-09-20 DIAGNOSIS — M2141 Flat foot [pes planus] (acquired), right foot: Secondary | ICD-10-CM | POA: Diagnosis not present

## 2016-09-20 MED ORDER — OXYCODONE-ACETAMINOPHEN 5-325 MG PO TABS: 1.0000 | ORAL_TABLET | Freq: Four times a day (QID) | ORAL | 0 refills | Status: DC | PRN

## 2016-09-21 NOTE — Progress Notes (Signed)
DOS 01.31.2018 Left gastroc recession (heel cord lengthening) fusion (shortening) of subtalar joint with screw fixation. Left cotton osteotomy.

## 2016-09-21 NOTE — Progress Notes (Signed)
Subjective: Cassandra Kennedy is a 36 y.o. is seen today in office s/p left STJ fusion with cotton osteotomy preformed on 09/15/16 . They state their pain is improved and she is down to taking about 1 percocet every 6 hours. She has been NWB. She is taking the lovenox. She has been icing and elevated regularly.  Denies any systemic complaints such as fevers, chills, nausea, vomiting. No calf pain, chest pain, shortness of breath.   Objective: General: No acute distress, AAOx3  DP/PT pulses palpable 2/4, CRT < 3 sec to all digits.  Protective sensation intact. Motor function intact.  Left foot: Incision is well coapted without any evidence of dehiscence and sutures and staples are intact. There is no surrounding erythema, ascending cellulitis, fluctuance, crepitus, malodor, drainage/purulence. There is mild edema around the surgical site. There is mild pain along the surgical site.  No other areas of tenderness to bilateral lower extremities.  No other open lesions or pre-ulcerative lesions.  No pain with calf compression, swelling, warmth, erythema.   Assessment and Plan:  Status post left foot surgery, doing well with no complications   -Treatment options discussed including all alternatives, risks, and complications -X-rays were obtained and reviewed with the patient. Hardware intact. No evidence of acute fracture.  -Antibiotic ointment and a bandage applied over the incisions. A well padded posterior splint was applied.  -Ice/elevation -Pain medication as needed. Refilled percocet that she can get filled later this week if needed. -Lovenox. Hold NSAIDs or ASA. Will do this for 2 weeks then switch to ASA.  -NWB -Monitor for any clinical signs or symptoms of infection and DVT/PE and directed to call the office immediately should any occur or go to the ER. -Follow-up in 1 week for POSSIBLE suture removal or sooner if any problems arise. In the meantime, encouraged to call the office with any  questions, concerns, change in symptoms.   Ovid CurdMatthew Wagoner, DPM

## 2016-09-27 ENCOUNTER — Ambulatory Visit (INDEPENDENT_AMBULATORY_CARE_PROVIDER_SITE_OTHER): Payer: Self-pay | Admitting: Podiatry

## 2016-09-27 DIAGNOSIS — Q6689 Other  specified congenital deformities of feet: Secondary | ICD-10-CM

## 2016-09-27 DIAGNOSIS — Z9889 Other specified postprocedural states: Secondary | ICD-10-CM

## 2016-09-27 DIAGNOSIS — M2142 Flat foot [pes planus] (acquired), left foot: Secondary | ICD-10-CM

## 2016-09-27 DIAGNOSIS — M2141 Flat foot [pes planus] (acquired), right foot: Secondary | ICD-10-CM

## 2016-09-27 DIAGNOSIS — M21961 Unspecified acquired deformity of right lower leg: Secondary | ICD-10-CM

## 2016-09-27 MED ORDER — OXYCODONE-ACETAMINOPHEN 5-325 MG PO TABS: 1.0000 | ORAL_TABLET | Freq: Four times a day (QID) | ORAL | 0 refills | Status: DC | PRN

## 2016-09-27 NOTE — Progress Notes (Signed)
Subjective: Cassandra Kennedy is a 36 y.o. is seen today in office s/p left STJ fusion with cotton osteotomy and gastroc recession preformed on 09/15/16 . She is still taking percocet 1 about every 6 hours but trying to decrease the amount. She has been NWB. She is taking the lovenox. She continues to ice and elevated regularly. Denies any systemic complaints such as fevers, chills, nausea, vomiting. No calf pain, chest pain, shortness of breath.   Objective: General: No acute distress, AAOx3  DP/PT pulses palpable 2/4, CRT < 3 sec to all digits.  Protective sensation intact. Motor function intact.  Left foot: Incision is well coapted without any evidence of dehiscence and sutures and staples are intact. There is no surrounding erythema, ascending cellulitis, fluctuance, crepitus, malodor, drainage/purulence. There is mild edema around the surgical site. There is mild pain along the surgical site, mostly along the lateral foot along the sinus tarsi.  No other areas of tenderness to bilateral lower extremities.  No other open lesions or pre-ulcerative lesions.  No pain with calf compression, swelling, warmth, erythema.   Assessment and Plan:  Status post left foot surgery, doing well with no complications   -Treatment options discussed including all alternatives, risks, and complications -Sutures to the gastroc recession and half of the cotton were removed. The rest reamained intact. Antibiotic ointment and a bandage was applied followed by a dressing. Keep clean, dry, intact. Posterior splint applied.  -Ice/elevation -Pain medication as needed. Refilled percocet that she can get filled later this week if needed. -Lovenox. Hold NSAIDs or ASA. Will do this for 2 weeks then switch to ASA.  -NWB at all times.  -Monitor for any clinical signs or symptoms of infection and DVT/PE and directed to call the office immediately should any occur or go to the ER. -Follow-up in 1 week for suture/staple removal or  sooner if any problems arise. In the meantime, encouraged to call the office with any questions, concerns, change in symptoms.   Ovid CurdMatthew Alamin Mccuiston, DPM

## 2016-10-01 ENCOUNTER — Ambulatory Visit (INDEPENDENT_AMBULATORY_CARE_PROVIDER_SITE_OTHER): Payer: BLUE CROSS/BLUE SHIELD

## 2016-10-01 ENCOUNTER — Ambulatory Visit (INDEPENDENT_AMBULATORY_CARE_PROVIDER_SITE_OTHER): Payer: BLUE CROSS/BLUE SHIELD | Admitting: Podiatry

## 2016-10-01 ENCOUNTER — Encounter: Payer: Self-pay | Admitting: Podiatry

## 2016-10-01 VITALS — BP 85/56 | HR 47

## 2016-10-01 DIAGNOSIS — M2141 Flat foot [pes planus] (acquired), right foot: Secondary | ICD-10-CM

## 2016-10-01 DIAGNOSIS — M216X2 Other acquired deformities of left foot: Secondary | ICD-10-CM

## 2016-10-01 DIAGNOSIS — Z9889 Other specified postprocedural states: Secondary | ICD-10-CM

## 2016-10-01 DIAGNOSIS — M2142 Flat foot [pes planus] (acquired), left foot: Secondary | ICD-10-CM

## 2016-10-01 DIAGNOSIS — Q6689 Other  specified congenital deformities of feet: Secondary | ICD-10-CM

## 2016-10-04 ENCOUNTER — Ambulatory Visit: Payer: BLUE CROSS/BLUE SHIELD

## 2016-10-04 ENCOUNTER — Other Ambulatory Visit: Payer: Self-pay | Admitting: *Deleted

## 2016-10-04 MED ORDER — OXYCODONE-ACETAMINOPHEN 5-325 MG PO TABS: 1.0000 | ORAL_TABLET | Freq: Four times a day (QID) | ORAL | 0 refills | Status: DC | PRN

## 2016-10-04 NOTE — Telephone Encounter (Signed)
Patient called and stated that she needed a refill of oxycodone 5-325 #30 and husband is coming by to pick it up. Wynton Hufstetler

## 2016-10-04 NOTE — Progress Notes (Signed)
Subjective: Anthonette Legatonna Hord is a 36 y.o. is seen today in office s/p left STJ fusion with cotton osteotomy and gastroc recession preformed on 09/15/16 .She states that she is doing well. She is still taking percocet every 6 hours if needed. She presents today for an unscheduled appointment she fell last night as she was off balance. She states that she hit her toes on the ground. She feels burning located the surgical site would have area checked. Denies any systemic complaints such as fevers, chills, nausea, vomiting. No calf pain, chest pain, shortness of breath.   Objective: General: No acute distress, AAOx3  DP/PT pulses palpable 2/4, CRT < 3 sec to all digits.  Protective sensation intact. Motor function intact.  Left foot: Incision is well coapted without any evidence of dehiscence and sutures and staples are intact. There is no surrounding erythema, ascending cellulitis, fluctuance, crepitus, malodor, drainage/purulence. There is mild edema around the surgical site. There is mild increase in pain along the surgical site, mostly along the lateral foot along the sinus tarsi however no other new areas of tenderness bilaterally.  No other areas of tenderness to bilateral lower extremities.  No other open lesions or pre-ulcerative lesions.  No pain with calf compression, swelling, warmth, erythema.   Assessment and Plan:  Status post left foot surgery, presents today s/p fall   -Treatment options discussed including all alternatives, risks, and complications -Sutures removed today to the dorsal medial incision as well as a lateral incision without complications. Antibiotic ointment was applied followed by a bandage. Well-padded below-knee posterior splint was applied and a concern about all bony prominences. -Pain medication as needed. I did not refill today.  -ASA daily  -NWB at all times.  -Monitor for any clinical signs or symptoms of infection and DVT/PE and directed to call the office  immediately should any occur or go to the ER. -Follow-up in 1 week for staple removal or sooner if any problems arise. In the meantime, encouraged to call the office with any questions, concerns, change in symptoms.   *once all staples/sutures removed can transition to a CAM boot and continue NWB for a total of at least 6 weeks postop.   Ovid CurdMatthew Wagoner, DPM

## 2016-10-08 ENCOUNTER — Ambulatory Visit (INDEPENDENT_AMBULATORY_CARE_PROVIDER_SITE_OTHER): Payer: BLUE CROSS/BLUE SHIELD | Admitting: Podiatry

## 2016-10-08 DIAGNOSIS — Q6689 Other  specified congenital deformities of feet: Secondary | ICD-10-CM

## 2016-10-09 NOTE — Progress Notes (Signed)
Subjective:     Patient ID: Cassandra Kennedy, female   DOB: 07/23/1981, 36 y.o.   MRN: [id number]  HPI patient states she's doing well with her left foot and is having minimal discomfort currently and has done well and the splint   Review of Systems     Objective:   Physical Exam Neurovascular status was found to be intact negative Homan sign was noted and patient's found to have well coapted incisions with staples in place on the posterior calf muscle and lateral and dorsal foot. There is moderate edema which is related to the nonweightbearing status but overall she's doing well with only mild discomfort    Assessment:     Doing well post foot and leg reconstruction by Dr. Ardelle AntonWagoner    Plan:     Every other staple was removed and several were left in his anchors sterile dressing reapplied advised on continued elevation and cam walker was dispensed to begin wearing at this time for the recovery process. She will be reevaluated by Dr. Ardelle AntonWagoner next week and is encouraged to call with any questions

## 2016-10-15 ENCOUNTER — Ambulatory Visit (INDEPENDENT_AMBULATORY_CARE_PROVIDER_SITE_OTHER): Payer: Self-pay | Admitting: Podiatry

## 2016-10-15 DIAGNOSIS — Z9889 Other specified postprocedural states: Secondary | ICD-10-CM

## 2016-10-15 DIAGNOSIS — Q6689 Other  specified congenital deformities of feet: Secondary | ICD-10-CM

## 2016-10-15 MED ORDER — OXYCODONE-ACETAMINOPHEN 5-325 MG PO TABS: 1.0000 | ORAL_TABLET | Freq: Four times a day (QID) | ORAL | 0 refills | Status: DC | PRN

## 2016-10-18 NOTE — Progress Notes (Signed)
Subjective: Cassandra Kennedy is a 36 y.o. is seen today in office s/p left STJ fusion with cotton osteotomy and gastroc recession preformed on 09/15/16. Last appointment she was seen by Dr. Charlsie Merlesegal the cam boot was dispensed. She presents later have the remainder the staples removed. She said that overall she is doing well and her swelling has improved. She still taking about 1 Percocet a day. She has tried taking half a Percocet was having some pains that she went back up to one Percocet and is completely relieves her pain. Denies any systemic complaints such as fevers, chills, nausea, vomiting. No calf pain, chest pain, shortness of breath.   Objective: General: No acute distress, AAOx3  DP/PT pulses palpable 2/4, CRT < 3 sec to all digits.  Protective sensation intact. Motor function intact.  Left foot: Incision is well coapted without any evidence of dehiscence and  some of the staples remain intact.Incisions are well coapted.  There is no surrounding erythema, ascending cellulitis, fluctuance, crepitus, malodor, drainage/purulence. There is mild edema around the surgical site. There is mild pain along the surgical sites. Since her fall she states that she's had some intermittent "nerve pain" this is not continuous.  No other areas of tenderness to bilateral lower extremities.  No other open lesions or pre-ulcerative lesions.  No pain with calf compression, swelling, warmth, erythema.   Assessment and Plan:  Status post left foot surgery  -Treatment options discussed including all alternatives, risks, and complications -Remainder the staples were removed today. -Continue nonweightbearing all times with cam boot. She can start some gentle ankle joint range of motion exercises -Continue ice and elevation. -Pain medication as needed. I did refill today. Hopefully at the next appointment go down to Vicodin.  -ASA daily  -NWB at all times.  -Monitor for any clinical signs or symptoms of infection and  DVT/PE and directed to call the office immediately should any occur or go to the ER. -Follow-up as scheduled or sooner if any problems arise. In the meantime, encouraged to call the office with any questions, concerns, change in symptoms.   Ovid CurdMatthew Wagoner, DPM

## 2016-11-01 ENCOUNTER — Encounter: Payer: Self-pay | Admitting: Podiatry

## 2016-11-01 ENCOUNTER — Ambulatory Visit (INDEPENDENT_AMBULATORY_CARE_PROVIDER_SITE_OTHER): Payer: BLUE CROSS/BLUE SHIELD

## 2016-11-01 ENCOUNTER — Ambulatory Visit (INDEPENDENT_AMBULATORY_CARE_PROVIDER_SITE_OTHER): Payer: Self-pay | Admitting: Podiatry

## 2016-11-01 VITALS — BP 113/82 | HR 85

## 2016-11-01 DIAGNOSIS — Z9889 Other specified postprocedural states: Secondary | ICD-10-CM

## 2016-11-01 DIAGNOSIS — M2142 Flat foot [pes planus] (acquired), left foot: Secondary | ICD-10-CM

## 2016-11-01 DIAGNOSIS — M2141 Flat foot [pes planus] (acquired), right foot: Secondary | ICD-10-CM

## 2016-11-01 DIAGNOSIS — Q6689 Other  specified congenital deformities of feet: Secondary | ICD-10-CM

## 2016-11-01 MED ORDER — TERBINAFINE HCL 250 MG PO TABS: 250.0000 mg | ORAL_TABLET | Freq: Every day | ORAL | 0 refills | Status: DC

## 2016-11-02 NOTE — Progress Notes (Addendum)
Subjective: Cassandra Kennedy is a 36 y.o. is seen today in office s/p left STJ fusion with cotton osteotomy and gastroc recession preformed on 09/15/16. She states that overall she is doing well. She states that she is not taking Percocet or any pain medication last 2 weeks. She states that she'll he gets very occasional pain if she turns on the incisions a certain way. She gets some occasional "nerve pain" points to the top along the medial incision however this is intermittent and not consecutive. She is remaining nonweightbearing. She does continue with the CAM boot.  Denies any systemic complaints such as fevers, chills, nausea, vomiting. No calf pain, chest pain, shortness of breath.   Objective: General: No acute distress, AAOx3  DP/PT pulses palpable 2/4, CRT < 3 sec to all digits.  Protective sensation intact. Motor function intact.  Left foot:gastrocnemius recession site as well as the lateral incision is well coapted the scar is formed. On the dorsal incision does appear to be some very minimal superficial skin breakdown. Protective scab is starting to form. There is no swelling erythema, ascending cellulitis, fluctuance, crepitus, malodor. There is no drainage or pus expressed. There is minimal tenderness to palpation on surgical sites. They're significantly improved edema and only minimaledema is present today. No other areas of tenderness to bilateral lower extremities.  No other open lesions or pre-ulcerative lesions.  No pain with calf compression, swelling, warmth, erythema.   Assessment and Plan:  Status post left foot surgery  -Treatment options discussed including all alternatives, risks, and complications -X-rays obtained and reviewed. Hardware intact. This increase in consultation along the arthrodesis, osteotomy sites. No evidence of acute fracture identified. -Recommended a small amount of Betadine along the dorsal incision. Blunt the area dry with scalpel forms in. Monitor for any  worsening any skin breakdown. -Discussed this week and she can start to be partial weightbearing with crutches if tolerable. Over the next 2 we weightbearing in a CAM boot but I do want her to use a crutch for assistance so she is not full WB. I will see her back in 2 weeks and repeat x-rays. If she is doing well, at that time she will be 2 months postop and we will start to walk in the CAM boot and start PT.  -Compound cream order for scarring. Can start on the gastroc and lateral incision. This was ordered through [employer].  -Monitor for any clinical signs or symptoms of infection and directed to call the office immediately should any occur or go to the ER.  Ovid CurdMatthew Bobette Leyh, DPM

## 2016-11-15 ENCOUNTER — Ambulatory Visit (INDEPENDENT_AMBULATORY_CARE_PROVIDER_SITE_OTHER): Payer: Self-pay | Admitting: Podiatry

## 2016-11-15 ENCOUNTER — Ambulatory Visit (INDEPENDENT_AMBULATORY_CARE_PROVIDER_SITE_OTHER): Payer: BLUE CROSS/BLUE SHIELD

## 2016-11-15 ENCOUNTER — Ambulatory Visit: Payer: BLUE CROSS/BLUE SHIELD

## 2016-11-15 DIAGNOSIS — Q6689 Other  specified congenital deformities of feet: Secondary | ICD-10-CM | POA: Diagnosis not present

## 2016-11-15 DIAGNOSIS — Z9889 Other specified postprocedural states: Secondary | ICD-10-CM

## 2016-11-17 NOTE — Progress Notes (Signed)
Subjective: Marleny Faller is a 36 y.o. is seen today in office s/p left STJ fusion with cotton osteotomy and gastroc recession preformed on 09/15/16. She states that she is doing well. She is not taking pain medication. She has started to do partial weight bearing to the left foot. She states that she has not really been putting more than 50% of her weight on the foot. She has no pain when doing this. No increase in swelling. She has remained in the CAM boot.  Denies any systemic complaints such as fevers, chills, nausea, vomiting. No calf pain, chest pain, shortness of breath.   Objective: General: No acute distress, AAOx3  DP/PT pulses palpable 2/4, CRT < 3 sec to all digits.  Protective sensation intact. Motor function intact.  Left foot: Incisions are all healed without any evidence of dehiscence including the dorsal medial incision. There is no swelling erythema, ascending cellulitis. Is no drainage or pus there is no malodor. No clinical signs of infection. There is no significant tenderness to palpation of the surgical sites. There is minimal edema. Overall her foot position is much improved compared to prior to surgery and there is arch present. No other open lesions or pre-ulcerative lesions.  No pain with calf compression, swelling, warmth, erythema.   Assessment and Plan:  Status post left foot surgery, doing well.   -Treatment options discussed including all alternatives, risks, and complications -X-rays were obtained and reviewed with the patient. Increased consolidation across the arthrodesis site. There is no evidence of acute fracture. She has a more rectus foot type present. Hardware intact. -At this time she can start to continue to weight-bear as tolerated more she can get to the point where she is weightbearing in the boot. Continue with the cam boot at all times however does not have to sleep in the boot at this time. -Will also start physical therapy. Prescriptions for at today  for [facility].  -RTC in 3 weeks or sooner if needed.   Ovid Curd, DPM

## 2016-11-30 ENCOUNTER — Telehealth: Payer: Self-pay | Admitting: *Deleted

## 2016-11-30 NOTE — Telephone Encounter (Addendum)
[facility] Mantis states they need the signed copy of the 11/18/4016 plan of care for pt. I informed Joni Reining I did not remember seeing the plan of care so it would be best to refax and we would present to Dr. Ardelle Anton to sign.01/31/2017-Pt states hurt surgical foot and has an appt tomorrow, what to do until seen. Left message informing pt she should go back into her surgical shoe or boot, either would decrease movement and continued injury to the affected are, if tolerates can take ibuprofen as the package directs, ice 3-4 times daily for 15-20 minutes per session, protection skin from ice pack with fabric.

## 2016-12-06 ENCOUNTER — Ambulatory Visit (INDEPENDENT_AMBULATORY_CARE_PROVIDER_SITE_OTHER): Payer: BLUE CROSS/BLUE SHIELD

## 2016-12-06 ENCOUNTER — Ambulatory Visit (INDEPENDENT_AMBULATORY_CARE_PROVIDER_SITE_OTHER): Payer: BLUE CROSS/BLUE SHIELD | Admitting: Podiatry

## 2016-12-06 ENCOUNTER — Encounter: Payer: Self-pay | Admitting: Podiatry

## 2016-12-06 DIAGNOSIS — Z9889 Other specified postprocedural states: Secondary | ICD-10-CM

## 2016-12-06 DIAGNOSIS — Q6689 Other  specified congenital deformities of feet: Secondary | ICD-10-CM | POA: Diagnosis not present

## 2016-12-06 DIAGNOSIS — M2142 Flat foot [pes planus] (acquired), left foot: Secondary | ICD-10-CM

## 2016-12-06 DIAGNOSIS — M779 Enthesopathy, unspecified: Secondary | ICD-10-CM

## 2016-12-06 DIAGNOSIS — M2141 Flat foot [pes planus] (acquired), right foot: Secondary | ICD-10-CM

## 2016-12-08 NOTE — Progress Notes (Signed)
Subjective: Cassandra Kennedy is a 36 y.o. is seen today in office s/p left STJ fusion with cotton osteotomy and gastroc recession preformed on 09/15/16. She has been ambulating in a cam boot and doing well. She denies any pain. She is also continue to physical therapy. She gets some discomfort when they're working on the scar from the gastroc recession site. She has tried going for very short steps without the cam boot but not done normal heel to toe walking. She's had no pain howeverwhile doing this. Denies any systemic complaints such as fevers, chills, nausea, vomiting. No calf pain, chest pain, shortness of breath.   Objective: General: No acute distress, AAOx3  DP/PT pulses palpable 2/4, CRT < 3 sec to all digits.  Protective sensation intact. Motor function intact.  Left foot: Incisions are all healed without any evidence of dehiscence including the dorsal medial incision. there does appear to be some scar adhesion along the distal portion a gastrocnemius recession site. There is no surroundingerythema, ascending cellulitis. Is no drainage or pus there is no malodor. No clinical signs of infection. There is no tenderness to palpation of the surgical sites. There is minimal edema. Overall her foot position is much improved compared to prior to surgery and there is arch present. No other open lesions or pre-ulcerative lesions.  No pain with calf compression, swelling, warmth, erythema.   Assessment and Plan:  Status post left foot surgery, doing well.   -Treatment options discussed including all alternatives, risks, and complications -X-rays were obtained and reviewed with the patient. Increased consolidation across the arthrodesis site. -at this time she can start to transition to a regular shoe as tolerated. Recommended continue with physical therapy. Discussed possible steroid injection around the scarred fusion for the gastrocnemius recession  If needed. We will hold off on this today. There is  no pain and hopefully she gradually increase his activity this will improve. -She was measured for new inserts today and this was only left side. This was sent to [facility] labs. -RTC 3 weeks  Ovid Curd, DPM

## 2016-12-27 ENCOUNTER — Ambulatory Visit (INDEPENDENT_AMBULATORY_CARE_PROVIDER_SITE_OTHER): Payer: BLUE CROSS/BLUE SHIELD

## 2016-12-27 ENCOUNTER — Ambulatory Visit (INDEPENDENT_AMBULATORY_CARE_PROVIDER_SITE_OTHER): Payer: BLUE CROSS/BLUE SHIELD | Admitting: Podiatry

## 2016-12-27 ENCOUNTER — Encounter: Payer: Self-pay | Admitting: Podiatry

## 2016-12-27 DIAGNOSIS — M2141 Flat foot [pes planus] (acquired), right foot: Secondary | ICD-10-CM

## 2016-12-27 DIAGNOSIS — M2142 Flat foot [pes planus] (acquired), left foot: Secondary | ICD-10-CM

## 2016-12-27 DIAGNOSIS — Z9889 Other specified postprocedural states: Secondary | ICD-10-CM

## 2016-12-27 DIAGNOSIS — Q6689 Other  specified congenital deformities of feet: Secondary | ICD-10-CM

## 2016-12-29 NOTE — Progress Notes (Signed)
Subjective: Cassandra Kennedy is a 36 y.o. is seen today in office s/p left STJ fusion with cotton osteotomy and gastroc recession preformed on 09/15/16. She states that overall she is doing well she's having no pain. She continues his physical therapy to continue to work on the strengthening rehabilitation. She states that her physical therapist did not want her wearing an orthotic all of time so arch did not get "lazy". She's been going barefoot for short distances around the house and she has been doing this without any pain. She says the scar to her cal better. Denies any systemic complaints such as fevers, chills, nausea, vomiting. No calf pain, chest pain, shortness of breath.   Objective: General: No acute distress, AAOx3  DP/PT pulses palpable 2/4, CRT < 3 sec to all digits.  Protective sensation intact. Motor function intact.  Left foot: Incisions are all healed without any evidence of dehiscence including the dorsal medial incision. there does appear to be some scar adhesion along the distal portion a gastrocnemius recession site however this does appear to be improving. There is no surrounding erythema, ascending cellulitis. There is no drainage or pus there is no malodor. No clinical signs of infection. There is no tenderness to palpation of the surgical sites. There is no significant edema. Overall her foot position is much improved compared to prior to surgery and there is arch present. The first ray is minimally dorsiflexed however the 1st MTPJ ROM appears to be retained.  No other open lesions or pre-ulcerative lesions.  No pain with calf compression, swelling, warmth, erythema.   Assessment and Plan:  Status post left foot surgery, doing well.   -Treatment options discussed including all alternatives, risks, and complications -X-rays were obtained and reviewed with the patient. Increased consolidation across the arthrodesis site. Hardware intact -recommended continue with physical  therapy. She can continue wearing a regular shoe. Her new orthotic on the left side was dispensed today and break in instructions were discussed. She granted start to increase her activity s tolerated in shoe insert increase her exercises well. -This time I'll see her back after she finishes physical therapy but encouraged to call any questions or concerns meantime or any changes.  Ovid CurdMatthew Ceniya Fowers, DPM

## 2017-01-14 NOTE — Addendum Note (Signed)
Addendum  created 01/14/17 1019 by Amillya Chavira, MD   Sign clinical note    

## 2017-02-01 ENCOUNTER — Ambulatory Visit (INDEPENDENT_AMBULATORY_CARE_PROVIDER_SITE_OTHER): Payer: BLUE CROSS/BLUE SHIELD | Admitting: Podiatry

## 2017-02-01 ENCOUNTER — Ambulatory Visit (HOSPITAL_BASED_OUTPATIENT_CLINIC_OR_DEPARTMENT_OTHER)
Admission: RE | Admit: 2017-02-01 | Discharge: 2017-02-01 | Disposition: A | Discharge status: Home / Self Care | Payer: BLUE CROSS/BLUE SHIELD | Source: Ambulatory Visit | Attending: Podiatry | Admitting: Podiatry

## 2017-02-01 DIAGNOSIS — M2141 Flat foot [pes planus] (acquired), right foot: Secondary | ICD-10-CM | POA: Diagnosis not present

## 2017-02-01 DIAGNOSIS — R52 Pain, unspecified: Secondary | ICD-10-CM | POA: Diagnosis not present

## 2017-02-01 DIAGNOSIS — M722 Plantar fascial fibromatosis: Secondary | ICD-10-CM

## 2017-02-01 DIAGNOSIS — Z9889 Other specified postprocedural states: Secondary | ICD-10-CM | POA: Diagnosis not present

## 2017-02-01 DIAGNOSIS — M2142 Flat foot [pes planus] (acquired), left foot: Secondary | ICD-10-CM

## 2017-02-01 DIAGNOSIS — M779 Enthesopathy, unspecified: Secondary | ICD-10-CM | POA: Diagnosis not present

## 2017-02-01 MED ORDER — MELOXICAM 15 MG PO TABS
15.0000 mg | ORAL_TABLET | Freq: Every day | ORAL | 0 refills | Status: AC
Start: 2017-02-01 — End: 2018-02-01

## 2017-02-04 NOTE — Progress Notes (Signed)
Subjective: 36 year old female presents the office today for concerns of possible left foot injury. She states that she does not recall specific injury to her left foot but she noticed that while walking around school she started to notice increased pain to her left foot. She points more to the medial aspect of the foot she points on the metatarsal dorsally as well as medially which she gets the majority pain. She states that she was going up and down or hills when the pain started. She's been wearing sandals. She has not been wearing her orthotics and she was not wearing them and the pain started. She was doing well up until this. No increase in swelling. She did start wearing the boot yesterday. This started about one week ago. Denies any systemic complaints such as fevers, chills, nausea, vomiting. No acute changes since last appointment, and no other complaints at this time.   Objective: AAO x3, NAD DP/PT pulses palpable bilaterally, CRT less than 3 seconds Is no significant swelling to the left foot there is no erythema or increase in warmth. There is tenderness to palpation although mild along the arch of the foot medially and there is some minimal discomfort along the medial and dorsal aspect of the first metatarsal. There is no pain along the surgical sites and the incisions are all well-healed. There is no other areas of tenderness identified at this time. MMT 5/5. She presents today wearing a cam boot. There is no open lesions identified. There is no pain with calf compression, swelling, warmth, erythema.   Assessment: Left foot pain, likely due to overuse an increase in activity   Plan: -Treatment options discussed including all alternatives, risks, and complications -Etiology of symptoms were discussed -X-rays ordered -There is no pain the vibratory sensation is minimal discomfort for for fracture although given her increased activity level consent possible stress fracture. Recommended  remain in the cam boot until next appointment.  -I discussed with her long-term she'll need an orthotic and she should not be wearing sandals are flat shoes. She would like to do a dress orthotic and she was molded today do dressing inserts. [facility]  -Follow-up in 3 weeks or sooner if needed. Call any questions or concerns.   Ovid CurdMatthew Jakelyn Squyres, DPM

## 2017-02-18 ENCOUNTER — Ambulatory Visit: Payer: BLUE CROSS/BLUE SHIELD | Admitting: Podiatry

## 2017-03-03 ENCOUNTER — Ambulatory Visit (INDEPENDENT_AMBULATORY_CARE_PROVIDER_SITE_OTHER): Payer: BLUE CROSS/BLUE SHIELD | Admitting: Podiatry

## 2017-03-03 DIAGNOSIS — M779 Enthesopathy, unspecified: Secondary | ICD-10-CM

## 2017-03-03 DIAGNOSIS — M2141 Flat foot [pes planus] (acquired), right foot: Secondary | ICD-10-CM

## 2017-03-03 DIAGNOSIS — M2142 Flat foot [pes planus] (acquired), left foot: Secondary | ICD-10-CM

## 2017-03-08 NOTE — Progress Notes (Signed)
Subjective: 36 year old female presents the office today for follow up evaluation of left foot pain and after getting her dress orthotics. She states that since her last appointment she did rest it for sometime in a cam boot ice and elevate but recently she has been back into a regular shoe and she has no pain to her left foot and overall she states that she is doing well. She denies he swelling or redness. Her new inserts are fitting well she states. She states that she has been active doing regular activities in a regular shoe without any problems. She states that she is happy with the outcome of the surgery and recovery from the injury. She has no other concerns today.  Objective: AAO x3, NAD DP/PT pulses palpable bilaterally, CRT less than 3 seconds Incision of the left foot and leg are well-healed from the prior surgery. There is no area of tenderness to palpation to the left foot. There is no pain along the surgical sites. There is no overlying edema, erythema, increase in warmth. Mild scarring lesion is still present to the posterior leg on the gastrocnemius recession however this is improving. She has not been applying any creams to help with the scar. There is no other area of tenderness within 5 bilaterally. No pre-lesions identified. There is no pain with calf compression, swelling, warmth, erythema.   Assessment: Resolved left foot pain as well as healed surgical sites left foot  Plan: -Treatment options discussed including all alternatives, risks, and complications -At this point she is doing well she has no area of tenderness and she can wear regular shoe without any problems. I did discuss that she should continue the orthotics avulsed supportive shoe gear which is doing a lot of walking or standing. I believe that the reason why the left foot started her was she was going up a hill and doing more walking without her inserts. -I do recommend continue with cocoa butter vitamin E cream  along the scar mostly on the gastrocnemius to help with the adhesions. In the future can consider steroid injections but she wishes to hold off today. -At this point she is doing well and having no pains and therefore discharged from the postoperative course as well as from the injury. However I encouraged to call the office with any questions or concerns or any change in symptoms and she agrees to this plan.  Ovid CurdMatthew Wagoner, DPM

## 2017-05-03 ENCOUNTER — Encounter: Payer: Self-pay | Admitting: Podiatry

## 2017-05-03 ENCOUNTER — Ambulatory Visit (INDEPENDENT_AMBULATORY_CARE_PROVIDER_SITE_OTHER): Payer: BLUE CROSS/BLUE SHIELD | Admitting: Podiatry

## 2017-05-03 ENCOUNTER — Ambulatory Visit (INDEPENDENT_AMBULATORY_CARE_PROVIDER_SITE_OTHER): Payer: BLUE CROSS/BLUE SHIELD

## 2017-05-03 DIAGNOSIS — L905 Scar conditions and fibrosis of skin: Secondary | ICD-10-CM

## 2017-05-03 DIAGNOSIS — M79671 Pain in right foot: Secondary | ICD-10-CM | POA: Diagnosis not present

## 2017-05-03 DIAGNOSIS — G8929 Other chronic pain: Secondary | ICD-10-CM

## 2017-05-03 DIAGNOSIS — Q6689 Other  specified congenital deformities of feet: Secondary | ICD-10-CM | POA: Diagnosis not present

## 2017-05-03 DIAGNOSIS — M779 Enthesopathy, unspecified: Secondary | ICD-10-CM

## 2017-05-04 ENCOUNTER — Telehealth: Payer: Self-pay | Admitting: *Deleted

## 2017-05-04 DIAGNOSIS — L905 Scar conditions and fibrosis of skin: Secondary | ICD-10-CM

## 2017-05-04 DIAGNOSIS — M779 Enthesopathy, unspecified: Secondary | ICD-10-CM

## 2017-05-04 NOTE — Telephone Encounter (Deleted)
-----   Message from Vivi Barrack, DPM sent at 05/03/2017  5:16 PM EDT ----- Can you please order a CT scan of her right foot and ankle to look for tarsal coalition, stj arthritis? This is for pre-op planning. Thanks.

## 2017-05-04 NOTE — Telephone Encounter (Addendum)
-----   Message from Vivi Barrack, DPM sent at 05/03/2017  5:16 PM EDT ----- Can you please order a CT scan of her right foot and ankle to look for tarsal coalition, stj arthritis? This is for pre-op planning. Thanks. 05/04/2017-Orders given to [facility] for pre-cert and faxed to [facility].

## 2017-05-05 NOTE — Progress Notes (Signed)
Subjective: 36 year old female presents the office today for surgical evaluation of right foot pain. She is interested in possibly having surgery to the right foot. She states the left foot is doing well however she is having a scar and she wants a referral to the [facility] static and laser in burn center to help with the scarring. She states that she does once the color improved and she is having some adhesions along the calf. She was to try lasering for this before pursuing any surgical scar revision. She's had of the outcome of the surgery and the left foot and her pain is greatly improved compared to what it was prior to surgery. 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 She states that she does get his foot after prolonged activity. Denies any systemic complaints such as fevers, chills, nausea, vomiting. No acute changes since last appointment, and no other complaints at this time.   Objective: AAO x3, NAD DP/PT pulses palpable bilaterally, CRT less than 3 seconds Incisions on the left foot are all well healed. There is a mild scar adhesion to the posterior calf on the gastrocnemius recession site. There is no area of tenderness identified on the left foot. On the right foot there is a significant decrease in medial arch height and there is mild tenderness on the sinus tarsi. There is no overlying edema, erythema, increase in warmth to the field able to range of motion. Upon putting the foot in a rectus position the first ray is dorsiflexed. No open lesions or pre-ulcerative lesions.  No pain with calf compression, swelling, warmth, erythema  Assessment: Right foot pain; scarring lesion left side  Plan: -All treatment options discussed with the patient including all alternatives, risks, complications.  -X-rays were obtained and reviewed with the patient. There is decreased joint space present subtalar joint. There is no evidence of acute  fracture. -At this point will her a CT scan for preoperative planning of the right foot to include the rear foot. We will try for the foot and ankle. -Discussed with her surgical intervention will await results of CT scan. -Regards the scar and left foot we will place a referral for [facility] in Burn Center -Patient encouraged to call the office with any questions, concerns, change in symptoms.   Ovid Curd, DPM

## 2017-05-06 NOTE — Telephone Encounter (Addendum)
-----   Message from Vivi Barrack, DPM sent at 05/05/2017  5:10 PM EDT ----- Can you please put in a referral for her to go to the [facility]? Their fax number is [phone] and Archie Patten is the person there. She has contacted them in regards to getting a referral.   She has scaring from surgery and she is interested in laser with them. 05/06/2017-Message notification for [facility] stated they would be closed on 05/06/2017 and reopen 05/09/2017, left message that I needed information concerning referral of pt: Cassandra Kennedy and to call our office and ask for Tomasa Hose, RN. Faxed referral to [phone] Attn:  Tonya as recommended by Dr. Ardelle Anton.

## 2017-05-10 ENCOUNTER — Telehealth: Payer: Self-pay | Admitting: *Deleted

## 2017-05-10 NOTE — Telephone Encounter (Signed)
"  Her [employer] requires authorization.  She's scheduled for Wednesday the 26th for a MRI.  Her [employer] requires authorization."

## 2017-05-11 ENCOUNTER — Other Ambulatory Visit: Payer: BLUE CROSS/BLUE SHIELD

## 2017-05-12 ENCOUNTER — Ambulatory Visit: Payer: BLUE CROSS/BLUE SHIELD | Admitting: Podiatry

## 2017-05-16 NOTE — Telephone Encounter (Signed)
The MRI was authorized.

## 2017-05-18 ENCOUNTER — Ambulatory Visit
Admission: RE | Admit: 2017-05-18 | Discharge: 2017-05-18 | Disposition: A | Discharge status: Home / Self Care | Payer: BLUE CROSS/BLUE SHIELD | Source: Ambulatory Visit | Attending: Podiatry | Admitting: Podiatry

## 2017-05-18 DIAGNOSIS — M779 Enthesopathy, unspecified: Secondary | ICD-10-CM

## 2017-05-24 ENCOUNTER — Ambulatory Visit (INDEPENDENT_AMBULATORY_CARE_PROVIDER_SITE_OTHER): Payer: BLUE CROSS/BLUE SHIELD | Admitting: Podiatry

## 2017-05-24 ENCOUNTER — Encounter: Payer: Self-pay | Admitting: Podiatry

## 2017-05-24 DIAGNOSIS — Q6689 Other  specified congenital deformities of feet: Secondary | ICD-10-CM | POA: Diagnosis not present

## 2017-05-24 DIAGNOSIS — M205X2 Other deformities of toe(s) (acquired), left foot: Secondary | ICD-10-CM

## 2017-05-25 DIAGNOSIS — M205X2 Other deformities of toe(s) (acquired), left foot: Secondary | ICD-10-CM | POA: Insufficient documentation

## 2017-05-25 NOTE — Progress Notes (Signed)
Subjective: Cassandra Kennedy presents the office today to review the CT scan for her right foot. She states that she is having only minimal symptoms in the right foot in the soft tissues lateral walking. She has no pain on a daily basis to the right foot at this time. She states that she would likely quit off on surgery for a right-sided this time. Her main concern today she still has some pain of the big toe joint on the left foot. She's having no pain on the surgical sites the left rear foot with majority symptoms are to the big toe joint. She's been trying to do range of motion exercises and therapy her pain discontinue she tries to bend her toe up. She states this been daily issue. She states that she is very happy that she do the surgery and her pain to her left foot is much improved compared to what it was prior to surgery and this is the only issue she's been having. She is also been be seen another doctor for the scarring to help the appearance. Denies any systemic complaints such as fevers, chills, nausea, vomiting. No acute changes since last appointment, and no other complaints at this time.   Objective: AAO x3, NAD DP/PT pulses palpable bilaterally, CRT less than 3 seconds There is decent range of motion of the first MTPJ left side however there is pain in range of motion in dorsiflexion of the left first MTPJ and there is tenderness along the joint. There is no edema, erythema, increase in warmth. The does be to be dorsiflexion the first ray. There is no pain on the prior surgical sites and there is no pain of the rear foot. There is no overlying edema, erythema, increase in warmth. Subjective range of motion decreased on the right side there is no sustaining tenderness palpation. Subjectively she does get some tenderness to the lateral aspect of the foot on the sinus tarsi but he is totally not experiencing this discomfort. No open lesions or pre-ulcerative lesions.  No pain with calf compression,  swelling, warmth, erythema  Assessment: Left hallux limitus treated elevation of the first ray; subtalar joint coalition right foot  Plan: -All treatment options discussed with the patient including all alternatives, risks, complications.  -Regards the right foot discussed the CT scan which revealed extensive coalition subtalar joint as well as lateral cuneiform, third metatarsal coalition. She has no pain in this area. For now she wishes to level any surgical intervention. Discussed in the future possible surgical intervention at the area becomes symptomatic or if there is any progression of arthritis or issues with her feet. -Regards the left that her main concern is the first MTPJ. This been ongoing the last couple of appointments and despite shoe gear change, orthotics as well as range of motion, rehabilitation exercises she continues to get pain. Discussed with her and first metatarsal osteotomy in order to help plantar flex the first metatarsal to help take pressure off the joint. I discussed with her the surgery as well as the postoperative course. At this point she wishes to proceed with surgical intervention for the left foot. -The incision placement as well as the postoperative course was discussed with the patient. I discussed risks of the surgery which include, but not limited to, infection, bleeding, pain, swelling, need for further surgery, delayed or nonhealing, painful or ugly scar, numbness or sensation changes, over/under correction, recurrence, transfer lesions, further deformity, hardware failure, DVT/PE, loss of toe/foot. Patient understands these risks and  wishes to proceed with surgery. The surgical consent was reviewed with the patient all 3 pages were signed. No promises or guarantees were given to the outcome of the procedure. All questions were answered to the best of my ability. Before the surgery the patient was encouraged to call the office if there is any further questions.  The surgery will be performed at the [facility] on an outpatient basis. -Patient encouraged to call the office with any questions, concerns, change in symptoms.   Ovid Curd, DPM

## 2017-05-27 ENCOUNTER — Telehealth: Payer: Self-pay | Admitting: *Deleted

## 2017-05-27 NOTE — Telephone Encounter (Signed)
"  I need to schedule my surgery with Dr. Ardelle Anton."  Do you have a date in mind?  "I'd like to do it any day except for October 24."  He can do it on October 31.  "Does he have time available on November 7?"  Yes, he does.  I'll get it scheduled.  You can register on-line with the surgical center, instructions are in the brochure that was given to you.  Someone will call you from the surgical center a day or two prior to surgery date with the arrival time.

## 2017-05-27 NOTE — Telephone Encounter (Signed)
"  I was calling to schedule a surgery with Dr. Ardelle Anton.  If you could, give me a call back."

## 2017-05-31 ENCOUNTER — Telehealth: Payer: Self-pay | Admitting: *Deleted

## 2017-05-31 NOTE — Telephone Encounter (Signed)
"  I am having a surgery done with Dr. Ardelle Anton in November.  I was calling to get the code that you will file with my insurance and the name of the procedure.  I just want to double check with my insurance that it should be covered which we believe it will.  Give me a call back."

## 2017-06-01 NOTE — Telephone Encounter (Signed)
I am returning your call.  You are having an [facility] Bunionectomy and the procedure code is [id number].  "That's the only procedure correct?"  Yes, that is the only procedure.

## 2017-06-13 ENCOUNTER — Telehealth: Payer: Self-pay | Admitting: *Deleted

## 2017-06-13 NOTE — Telephone Encounter (Signed)
"  My wife is scheduled for surgery next Wednesday.  I am calling to see what we need to do to get the surgery authorized.  I called my insurance and they said they will cover it if it is proven to be medically necessary."  What insurance does she have?  "She has BCBS."  Is the BCBS out of state?  "No, it is not."  Normally [employer] of [place] does not require authorization.  We will make sure it's not needed.  "Okay, thank you so much."

## 2017-06-16 HISTORY — PX: FOOT SURGERY: SHX648

## 2017-06-22 ENCOUNTER — Encounter: Payer: Self-pay | Admitting: Podiatry

## 2017-06-22 DIAGNOSIS — M2022 Hallux rigidus, left foot: Secondary | ICD-10-CM

## 2017-06-23 ENCOUNTER — Telehealth: Payer: Self-pay | Admitting: *Deleted

## 2017-06-23 NOTE — Telephone Encounter (Signed)
Called patient and left a message for the patient to call me-I was calling to check on the patient after surgery was done yesterday with Dr Ardelle AntonWagoner and stated to call the [place] office at [phone] and if any concerns or questions to call the office. Misty StanleyLisa

## 2017-06-27 ENCOUNTER — Encounter: Payer: Self-pay | Admitting: Podiatry

## 2017-06-27 ENCOUNTER — Ambulatory Visit (INDEPENDENT_AMBULATORY_CARE_PROVIDER_SITE_OTHER): Payer: BLUE CROSS/BLUE SHIELD | Admitting: Podiatry

## 2017-06-27 ENCOUNTER — Ambulatory Visit (INDEPENDENT_AMBULATORY_CARE_PROVIDER_SITE_OTHER): Payer: BLUE CROSS/BLUE SHIELD

## 2017-06-27 DIAGNOSIS — M205X2 Other deformities of toe(s) (acquired), left foot: Secondary | ICD-10-CM

## 2017-06-28 NOTE — Progress Notes (Signed)
Subjective: Cassandra Kennedy is a 36 y.o. is seen today in office s/p left [place] bunionectomy preformed on 06/22/2017. They state their pain is controlled and she is only taking ibuprofen and Motrin for the pain.  She has been in the cam boot but she does use her crutches to help take pressure off the area and she has not been putting much weight to her foot.. Denies any systemic complaints such as fevers, chills, nausea, vomiting. No calf pain, chest pain, shortness of breath.   Objective: General: No acute distress, AAOx3  DP/PT pulses palpable 2/4, CRT < 3 sec to all digits.  Protective sensation intact. Motor function intact.  left foot: Incision is well coapted without any evidence of dehiscence and sutures are intact. There is no surrounding erythema, ascending cellulitis, fluctuance, crepitus, malodor, drainage/purulence. There is mild edema around the surgical site. There is mild pain along the surgical site.  There does appear to be decreased range of motion first MTPJ compared to preoperatively.  Prior to surgery she had a bulbous type area on the dorsal joint which is no longer present. No other areas of tenderness to bilateral lower extremities.  No other open lesions or pre-ulcerative lesions.  No pain with calf compression, swelling, warmth, erythema.   Assessment and Plan:  Status post left foot surgery, doing well with no complications   -Treatment options discussed including all alternatives, risks, and complications -X-rays were obtained and reviewed.  Status post first metatarsal osteotomy with hardware fixation.  There is no evidence of acute fracture otherwise. -Incision appears to be doing well today.  Antibiotic ointment was applied followed by a bandage.  Keep the dressing clean, dry, intact -Continue cam boot at all times.  She can be weightbearing as tolerated.  -Ice/elevation -Pain medication as needed. -Monitor for any clinical signs or symptoms of infection and  DVT/PE and directed to call the office immediately should any occur or go to the ER. -Follow-up in 10 days or sooner if any problems arise. In the meantime, encouraged to call the office with any questions, concerns, change in symptoms.   Ovid CurdMatthew Tyrrell Stephens, DPM

## 2017-07-04 ENCOUNTER — Ambulatory Visit (INDEPENDENT_AMBULATORY_CARE_PROVIDER_SITE_OTHER): Payer: BLUE CROSS/BLUE SHIELD | Admitting: Podiatry

## 2017-07-04 ENCOUNTER — Encounter: Payer: BLUE CROSS/BLUE SHIELD | Admitting: Podiatry

## 2017-07-04 ENCOUNTER — Encounter: Payer: Self-pay | Admitting: Podiatry

## 2017-07-04 DIAGNOSIS — M205X2 Other deformities of toe(s) (acquired), left foot: Secondary | ICD-10-CM | POA: Diagnosis not present

## 2017-07-04 NOTE — Progress Notes (Signed)
Subjective: Cassandra Kennedy is a 36 y.o. is seen today in office s/p left [place] bunionectomy preformed on 06/22/2017. She states that she is doing well. She was on her foot a lot more this weekend and she has been getting some pain to the ball of the foot but this just started yesterday. Denies any injury to the area.  Denies any systemic complaints such as fevers, chills, nausea, vomiting. No calf pain, chest pain, shortness of breath.   Objective: General: No acute distress, AAOx3; presents walking in CAM boot.  DP/PT pulses palpable 2/4, CRT < 3 sec to all digits.  Protective sensation intact. Motor function intact.  left foot: Incision is well coapted without any evidence of dehiscence and sutures are intact.  There is no surrounding erythema, ascending saline is pre-there is no drainage or pus.  There is no clinical signs of infection.  There is improved range of motion of the first MTPJ and there is no crepitation.  There is no area pinpoint tenderness of pain to vibratory sensation to other areas of the foot.  Trace edema. No other open lesions or pre-ulcerative lesions.  No pain with calf compression, swelling, warmth, erythema.   Assessment and Plan:  Status post left foot surgery, doing well with no complications   -Treatment options discussed including all alternatives, risks, and complications -Incision appears to be healing well.  Antibiotic ointment was applied followed by a bandage.  She can start to shower on Wednesday.  Ice and elevation.  Continue to ambulate in the cam boot. Monitor for any clinical signs or symptoms of infection and directed to call the office immediately should any occur or go to the ER. Follow up in 2 weeks or sooner if needed.  Vivi BarrackMatthew R Wagoner DPM

## 2017-07-14 ENCOUNTER — Encounter: Payer: BLUE CROSS/BLUE SHIELD | Admitting: Podiatry

## 2017-07-22 ENCOUNTER — Ambulatory Visit (INDEPENDENT_AMBULATORY_CARE_PROVIDER_SITE_OTHER): Payer: BLUE CROSS/BLUE SHIELD | Admitting: Podiatry

## 2017-07-22 ENCOUNTER — Ambulatory Visit (INDEPENDENT_AMBULATORY_CARE_PROVIDER_SITE_OTHER): Payer: BLUE CROSS/BLUE SHIELD

## 2017-07-22 DIAGNOSIS — M205X2 Other deformities of toe(s) (acquired), left foot: Secondary | ICD-10-CM

## 2017-07-26 NOTE — Progress Notes (Signed)
Subjective: Cassandra Kennedy is a 36 y.o. is seen today in office s/p left [place] bunionectomy preformed on 06/22/2017. She states that she is doing great and not having any pain. She has been wearing the CAM boot but also the surgical shoe at times. She is not taking any pain medication. She states that she can bend her toes and they are not popping and the big toe joint does not hurt. Denies any systemic complaints such as fevers, chills, nausea, vomiting. No calf pain, chest pain, shortness of breath.   Objective: General: No acute distress, AAOx3; presents walking in CAM boot.  DP/PT pulses palpable 2/4, CRT < 3 sec to all digits.  Protective sensation intact. Motor function intact.  left foot: Incision is well coapted without any evidence of dehiscence and sutures are intact.  There is no surrounding erythema, ascending cellulitis and there is no drainage or pus.  There is no clinical signs of infection.  There is improved range of motion of the first MTPJ and there is no crepitation or pain with MTPJ ROM today.  There is no area pinpoint tenderness of pain to vibratory sensation to other areas of the foot.  Trace edema. No other open lesions or pre-ulcerative lesions.  No pain with calf compression, swelling, warmth, erythema.   Assessment and Plan:  Status post left foot surgery, doing well with no complications   -Treatment options discussed including all alternatives, risks, and complications -X-rays were obtained and reviewed with the patient. Hardware intact. There is no evidence of acute fracture. Increased consolidation across the osteotomy site.  -At this point will start PT- [facility] -Surgical shoe. -ROM exercises discussed -Ice/elevation -Monitor for any clinical signs or symptoms of infection and directed to call the office immediately should any occur or go to the ER. -RTC 2 weeks or sooner if needed  *x-ray left foot next appointment; likely transition to a  regular shoe   Vivi BarrackMatthew R Wagoner DPM

## 2017-07-27 ENCOUNTER — Telehealth: Payer: Self-pay | Admitting: *Deleted

## 2017-07-27 DIAGNOSIS — M205X2 Other deformities of toe(s) (acquired), left foot: Secondary | ICD-10-CM

## 2017-07-27 NOTE — Telephone Encounter (Signed)
Dr. Ardelle AntonWagoner ordered PT on 07/22/2017. Orders entered in Epic and [facility] to [facility] [address]

## 2017-08-05 ENCOUNTER — Ambulatory Visit (INDEPENDENT_AMBULATORY_CARE_PROVIDER_SITE_OTHER): Payer: BLUE CROSS/BLUE SHIELD | Admitting: Podiatry

## 2017-08-05 ENCOUNTER — Ambulatory Visit (INDEPENDENT_AMBULATORY_CARE_PROVIDER_SITE_OTHER): Payer: BLUE CROSS/BLUE SHIELD

## 2017-08-05 DIAGNOSIS — M205X2 Other deformities of toe(s) (acquired), left foot: Secondary | ICD-10-CM

## 2017-08-06 NOTE — Progress Notes (Signed)
Subjective: Cassandra Kennedy is a 36 y.o. is seen today in office s/p left [place] bunionectomy preformed on 06/22/2017.  She says that overall she is doing well.  She has noticed an increase in swelling since doing physical therapy and she states that physical therapy has been tough but is been helpful.  She is been walking in the boot.  Denies any recent injury or trauma.  She has no new concerns otherwise. Denies any systemic complaints such as fevers, chills, nausea, vomiting. No calf pain, chest pain, shortness of breath.   Objective: General: No acute distress, AAOx3; presents walking in CAM boot.  DP/PT pulses palpable 2/4, CRT < 3 sec to all digits.  Protective sensation intact. Motor function intact.  left foot: Incision is well coapted without any evidence of dehiscence and a scar has formed.  There is no surrounding erythema, ascending cellulitis and there is no drainage or pus.  There is no clinical signs of infection.  There is much improved range of motion compared to pre-op of the first MTPJ and there is no crepitation or pain with MTPJ ROM today.  There is no area pinpoint tenderness of pain to vibratory sensation to other areas of the foot.  There is a mild increase in edema to the first MPJ but she states this is started with physical therapy and does improve. No other open lesions or pre-ulcerative lesions.  No pain with calf compression, swelling, warmth, erythema.   Assessment and Plan:  Status post left foot surgery, doing well with no complications   -Treatment options discussed including all alternatives, risks, and complications -X-rays were obtained and reviewed.  Evidence of first metatarsal osteotomy which is healing well.  Prior medial cuneiform osteotomy with graft. -At this time she can start to transition to regular shoe as tolerated. -Compression anklet dispensed.  Continue ice elevation as well. -Finish physical therapy -Monitor for any clinical signs or symptoms  of infection and directed to call the office immediately should any occur or go to the ER. -RTC 4 weeks or sooner if needed  *x-ray left foot next appointment  Vivi BarrackMatthew R Wagoner DPM

## 2017-08-16 NOTE — L&D Delivery Note (Signed)
Delivery Note  First Stage: Labor onset: 06/03/18 @ 0300 Augmentation : Cervical balloon, Cytotec, and Pitocin Analgesia /Anesthesia intrapartum: Hydrotherapy, Epidural SROM at 0300 on 06/03/18  Second Stage: Complete dilation at 0350 on 06/04/18 Onset of pushing at 0344 FHR second stage Cat II  Variable decels w/ baseline in the 110's during second stage. Positive response to intra-uterine resuscitation. Coached pushing to reduce anterior lip. Pushing in semi-fowlers, left and right lateral, and tilt positions. Tug of war method to assist vertex through mid-pelvis. Delivery of a viable female at 50 by SNM and CNM in ROA to ROT position. No nuchal cord. Infant placed on mother's abd, dried and stimulated. Cord double clamped after 2 min, cut by CJ (father) Arterial cord gas and cord blood sample collected    Third Stage: Placenta delivered [facility] intact with 3 VC @ [phone]  Placenta disposition: L&D Uterine tone firm after massage   Right labial splay and sulcus laceration identified  Anesthesia for repair: Epidural Repair standard fashion with 2-0 Vicryl for sulcus and 4-0 Vicryl for labial Est. Blood Loss (mL): 300 AMTSL  Complications: Variable decels  Mom to postpartum.  Baby to Couplet care / Skin to Skin.  Newborn: Birth Weight: pending  Apgar Scores: 1-minute: 7                           5-minute: 8   Feeding planned: Breast Declines circ  Cassandra Kennedy, SNM 06/04/2018 5:46 AM

## 2017-09-02 ENCOUNTER — Ambulatory Visit (INDEPENDENT_AMBULATORY_CARE_PROVIDER_SITE_OTHER): Payer: BLUE CROSS/BLUE SHIELD

## 2017-09-02 ENCOUNTER — Ambulatory Visit (INDEPENDENT_AMBULATORY_CARE_PROVIDER_SITE_OTHER): Payer: BLUE CROSS/BLUE SHIELD | Admitting: Podiatry

## 2017-09-02 ENCOUNTER — Other Ambulatory Visit: Payer: Self-pay | Admitting: Podiatry

## 2017-09-02 DIAGNOSIS — M2012 Hallux valgus (acquired), left foot: Secondary | ICD-10-CM | POA: Diagnosis not present

## 2017-09-02 DIAGNOSIS — M2011 Hallux valgus (acquired), right foot: Secondary | ICD-10-CM

## 2017-09-02 MED ORDER — DICLOFENAC SODIUM 1 % TD GEL: 2.0000 g | Freq: Four times a day (QID) | TRANSDERMAL | 2 refills | Status: DC

## 2017-09-05 NOTE — Progress Notes (Addendum)
Subjective: Cassandra Kennedy is a 37 y.o. is seen today in office s/p left [place] bunionectomy preformed on 06/22/2017.  Since last appointment she has been walking in his shoe with orthotic.  She states that max her pain level 6/10 when she tries to bend her toe is only with walking.  She has no pain when she tries to move the toe on her own.  She has not has any significant increase in swelling or any drainage to the incision the incision is well-healed.  She denies any redness or warmth to her foot.  She has no other concerns today. Denies any systemic complaints such as fevers, chills, nausea, vomiting. No calf pain, chest pain, shortness of breath.   Objective: General: No acute distress, AAOx3; presents walking in CAM boot.  DP/PT pulses palpable 2/4, CRT < 3 sec to all digits.  Protective sensation intact. Motor function intact.  left foot: Incision is well coapted without any evidence of dehiscence and a scar has formed.  There is no pain with MPJ range of motion.  Unable to fully dorsiflex the MPJ without any discomfort.  She has no area pinpoint tenderness identified today.  There is mild edema to the surgical site however this is minimal.  There is no clinical signs of infection noted today. No other open lesions or pre-ulcerative lesions.  No pain with calf compression, swelling, warmth, erythema.   Assessment and Plan:  Status post left foot surgery, doing well with no complications   -Treatment options discussed including all alternatives, risks, and complications -X-rays were obtained and reviewed.  Evidence of first metatarsal osteotomy which is healing well.  Prior medial cuneiform osteotomy with graft. -She is wearing the orthotic that she is wearing an Ugg style boot.  We discussed her to wear more supportive shoe with the orthotic.  I do think the pain she is having is more biomechanical but she has no pain on exam it is only when she does a lot of walking.  I think a change in  the shoe will hopefully help.  She has not 1 physical therapy currently doing exercises at home and I want her to continue with this.  Did ice and elevation.  Prescribed Voltaren gel -Monitor for any clinical signs or symptoms of infection and directed to call the office immediately should any occur or go to the ER. -RTC as scheduled or sooner if needed  Vivi BarrackMatthew R Wagoner DPM

## 2017-09-08 ENCOUNTER — Telehealth: Payer: Self-pay

## 2017-09-08 MED ORDER — NONFORMULARY OR COMPOUNDED ITEM: 120.0000 g | Freq: Four times a day (QID) | 2 refills | Status: DC

## 2017-09-08 NOTE — Telephone Encounter (Signed)
For Voltaren Gel 1% was denied by insurance.  Rx for [facility] topical anti-inflammatory cream written and faxed to pharmacy

## 2017-09-13 ENCOUNTER — Telehealth: Payer: Self-pay | Admitting: *Deleted

## 2017-09-13 NOTE — Telephone Encounter (Signed)
[facility] informed [facility] would send the proper demographics for the order. [facility]

## 2017-09-13 NOTE — Telephone Encounter (Signed)
Cassandra Kennedy - [facility] states the wrong demographic she was sent with pt's order.

## 2017-09-16 NOTE — Progress Notes (Signed)
DOS 06/22/17 Lt foot surgical correction of pain in the big toe joint w/ cutting and repositioning of the metatarsal

## 2017-09-30 ENCOUNTER — Ambulatory Visit: Payer: BLUE CROSS/BLUE SHIELD | Admitting: Podiatry

## 2017-10-04 ENCOUNTER — Ambulatory Visit: Payer: BLUE CROSS/BLUE SHIELD | Admitting: Podiatry

## 2017-10-11 ENCOUNTER — Ambulatory Visit: Payer: BLUE CROSS/BLUE SHIELD | Admitting: Podiatry

## 2018-01-06 ENCOUNTER — Ambulatory Visit (INDEPENDENT_AMBULATORY_CARE_PROVIDER_SITE_OTHER): Payer: BLUE CROSS/BLUE SHIELD

## 2018-01-06 ENCOUNTER — Ambulatory Visit: Payer: BLUE CROSS/BLUE SHIELD | Admitting: Podiatry

## 2018-01-06 ENCOUNTER — Ambulatory Visit (INDEPENDENT_AMBULATORY_CARE_PROVIDER_SITE_OTHER): Payer: BLUE CROSS/BLUE SHIELD | Admitting: Podiatry

## 2018-01-06 DIAGNOSIS — M205X2 Other deformities of toe(s) (acquired), left foot: Secondary | ICD-10-CM | POA: Diagnosis not present

## 2018-01-11 NOTE — Progress Notes (Signed)
Subjective: Cassandra Kennedy presents the office today for concerns of some pain along the bunion site on the left foot.  Since I last saw her she is now 5 months pregnant.  She denies any recent injury or trauma to the area no significant increase in swelling.  She states that she has had some discomfort to the area since I last saw her she is never completely gone away.  She is been wearing orthotics.  Denies any systemic complaints such as fevers, chills, nausea, vomiting. No acute changes since last appointment, and no other complaints at this time.   Objective: AAO x3, NAD DP/PT pulses palpable bilaterally, CRT less than 3 seconds There is still some genuine long dorsiflexion of the first MPJ causing discomfort.  There is no edema, erythema, increased warmth.  There is no area pinpoint bony tenderness pain to vibratory sensation.  There is no tenderness identified otherwise. No open lesions or pre-ulcerative lesions.  No pain with calf compression, swelling, warmth, erythema  Assessment: Capsulitis first MPJ  Plan: -All treatment options discussed with the patient including all alternatives, risks, complications.  -Will hold off on medications due to her pregnancy. I do think we should modify her orthotic to help take pressure off the joint.  I will have her come back in to see Raiford Noble or [facility] for modifications for inserts.  She has Voltaren gel at home that she never used and I recommend her to discuss this with her OB/GYN about potentially using this. -Patient encouraged to call the office with any questions, concerns, change in symptoms.   Cassandra Kennedy DPM

## 2018-01-12 ENCOUNTER — Other Ambulatory Visit: Payer: BLUE CROSS/BLUE SHIELD | Admitting: Orthotics

## 2018-01-16 ENCOUNTER — Ambulatory Visit: Payer: BLUE CROSS/BLUE SHIELD | Admitting: Orthotics

## 2018-01-16 DIAGNOSIS — M2011 Hallux valgus (acquired), right foot: Secondary | ICD-10-CM

## 2018-01-16 DIAGNOSIS — M205X2 Other deformities of toe(s) (acquired), left foot: Secondary | ICD-10-CM

## 2018-01-16 DIAGNOSIS — M21862 Other specified acquired deformities of left lower leg: Secondary | ICD-10-CM

## 2018-01-16 NOTE — Progress Notes (Signed)
Patient came in today per dr. Jacqualyn Kennedy for modification of old foot orthotics:  Needs first met head cut out and offload 1st met FHL w reverse.   However patient asks [name] to check coverage for a new pair.

## 2018-01-18 ENCOUNTER — Telehealth: Payer: Self-pay | Admitting: Podiatry

## 2018-01-18 NOTE — Telephone Encounter (Signed)
Orthotics are covered at 80% after 2700.00 deductible and pt is wanting to go ahead and work on the ones she had and will probably get a new pair later in the year once deductible has been met.

## 2018-02-03 ENCOUNTER — Encounter: Payer: BLUE CROSS/BLUE SHIELD | Admitting: Advanced Practice Midwife

## 2018-02-06 ENCOUNTER — Other Ambulatory Visit: Payer: BLUE CROSS/BLUE SHIELD | Admitting: Orthotics

## 2018-02-08 ENCOUNTER — Ambulatory Visit: Payer: BLUE CROSS/BLUE SHIELD | Admitting: Orthotics

## 2018-02-08 DIAGNOSIS — M21862 Other specified acquired deformities of left lower leg: Secondary | ICD-10-CM

## 2018-02-08 DIAGNOSIS — M205X2 Other deformities of toe(s) (acquired), left foot: Secondary | ICD-10-CM

## 2018-02-08 DIAGNOSIS — M2011 Hallux valgus (acquired), right foot: Secondary | ICD-10-CM

## 2018-02-09 NOTE — Progress Notes (Signed)
Patient pleased with adjustments made on f/o

## 2018-04-13 ENCOUNTER — Institutional Professional Consult (permissible substitution): Payer: BLUE CROSS/BLUE SHIELD | Admitting: Pediatrics

## 2018-04-23 IMAGING — DX DG OS CALCIS 2+V*L*
2 series · 2 of 2 positions shown · non-contrast
Comparison: 08/23/2016

CLINICAL DATA: Postoperative for subtalar fusion and osteotomy.

EXAM:
LEFT OS CALCIS - 2+ VIEW

[calcaneus axial]
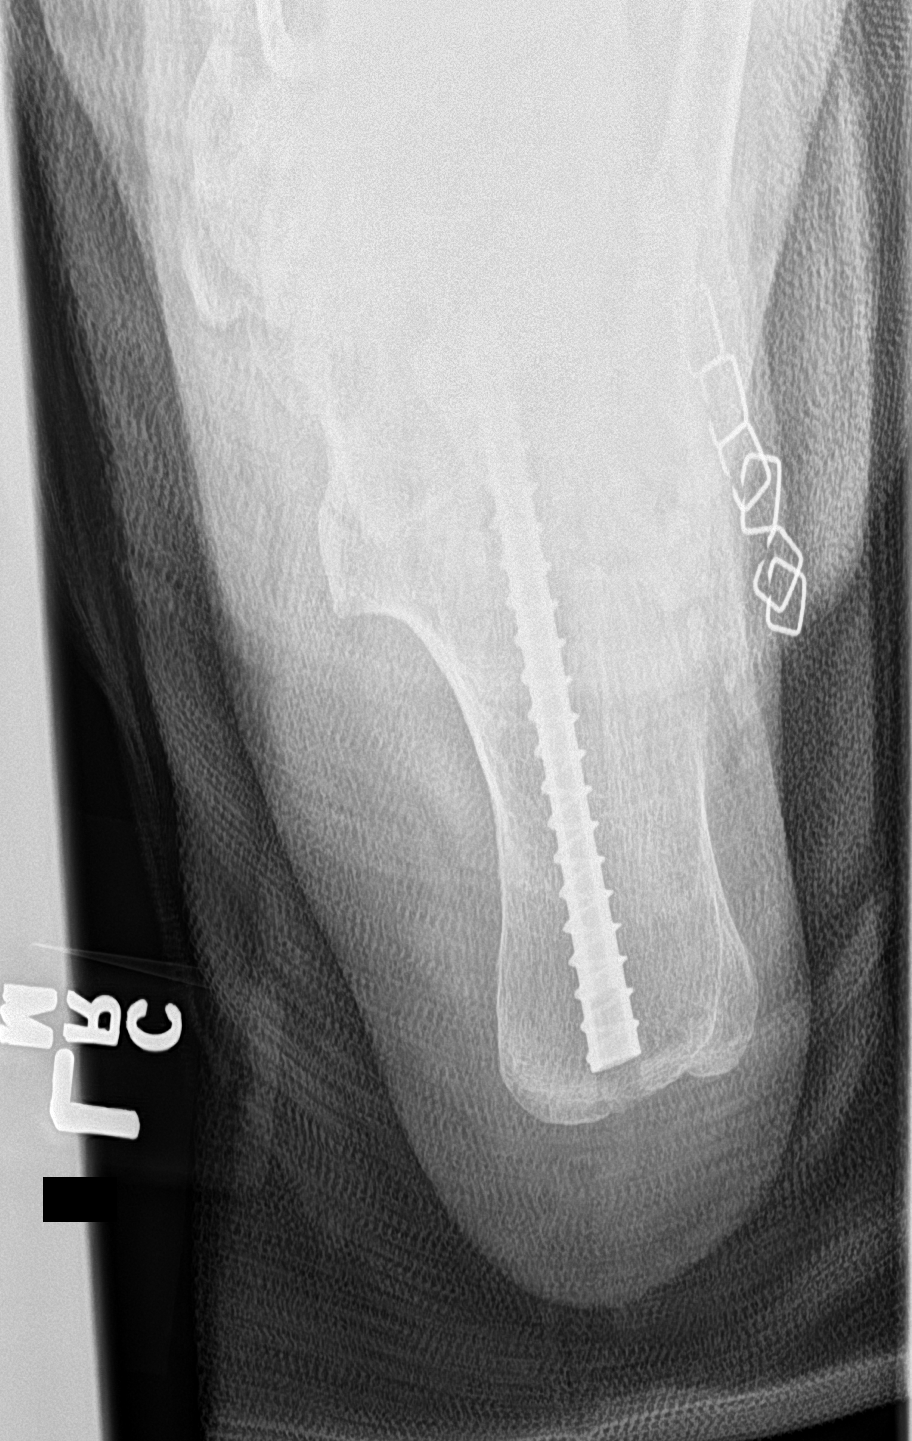

[calcaneus lat]
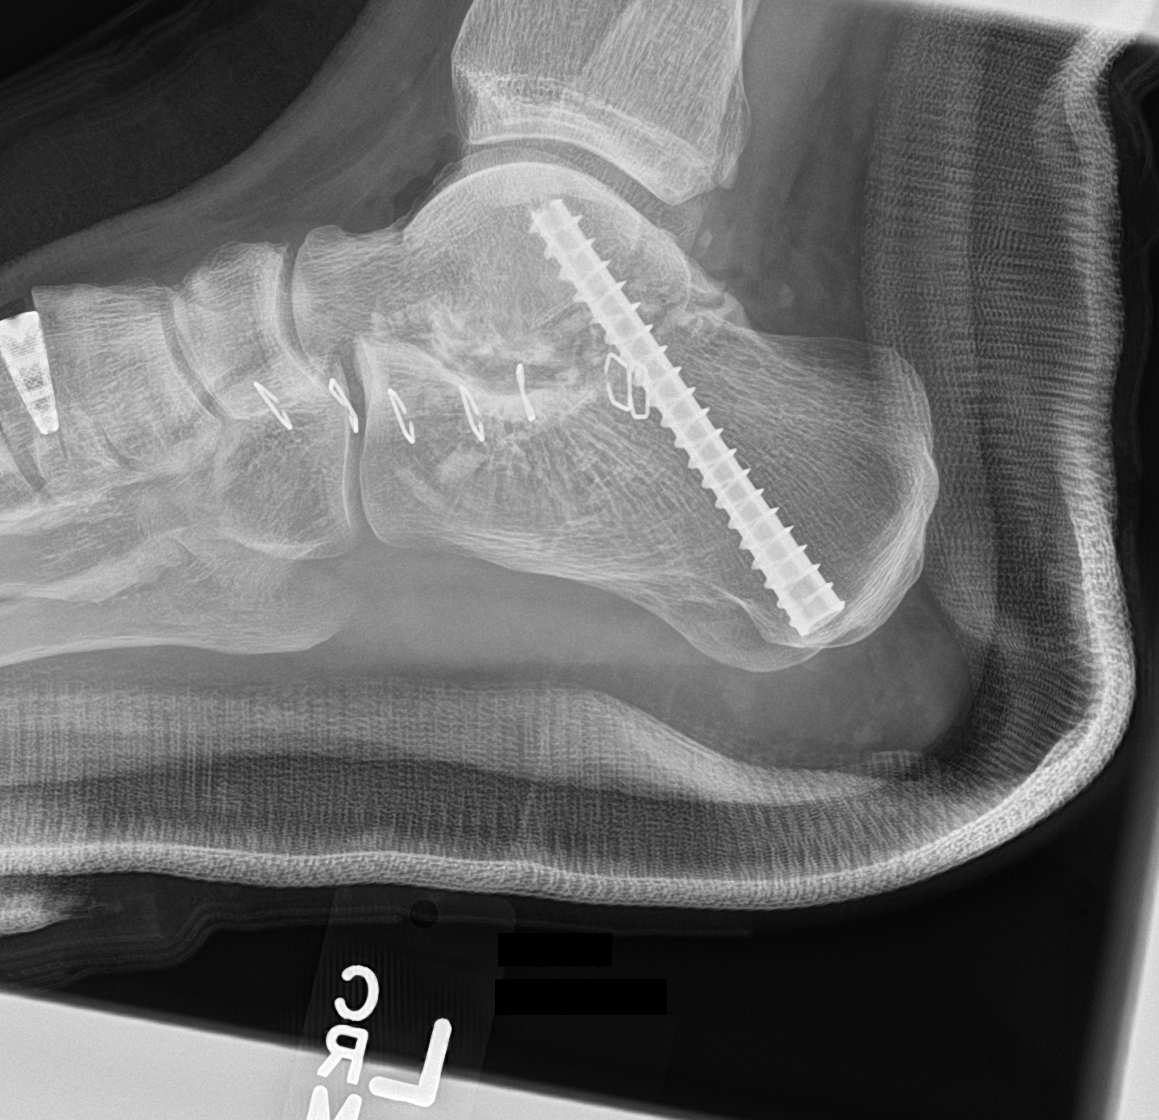

[2 of 2 positions shown; findings below may reference images not displayed]

FINDINGS: Bony detail obscured by the fiberglass splint.

Threaded cannulated cylinder/screw across the subtalar joint from
posterior inferior calcaneal to talar orientation. Rough thinned
appearance of the subtalar joint from interval fusion.

Wedge-shaped metal density implement projecting over the medial
cuneiform compatible with Cotton osteotomy.

No complicating features.
IMPRESSION: 1. Postoperative findings from hindfoot fusion and caught in a
osteotomy without unexpected fracture or complicating features.

## 2018-04-23 IMAGING — DX DG FOOT 2V*L*
2 series · 2 of 2 positions shown · non-contrast
Comparison: Portable exam 8987 hours compared intraoperative images
of 09/15/2016

CLINICAL DATA: Post foot surgery

EXAM:
LEFT FOOT - 2 VIEW

[foot ap]
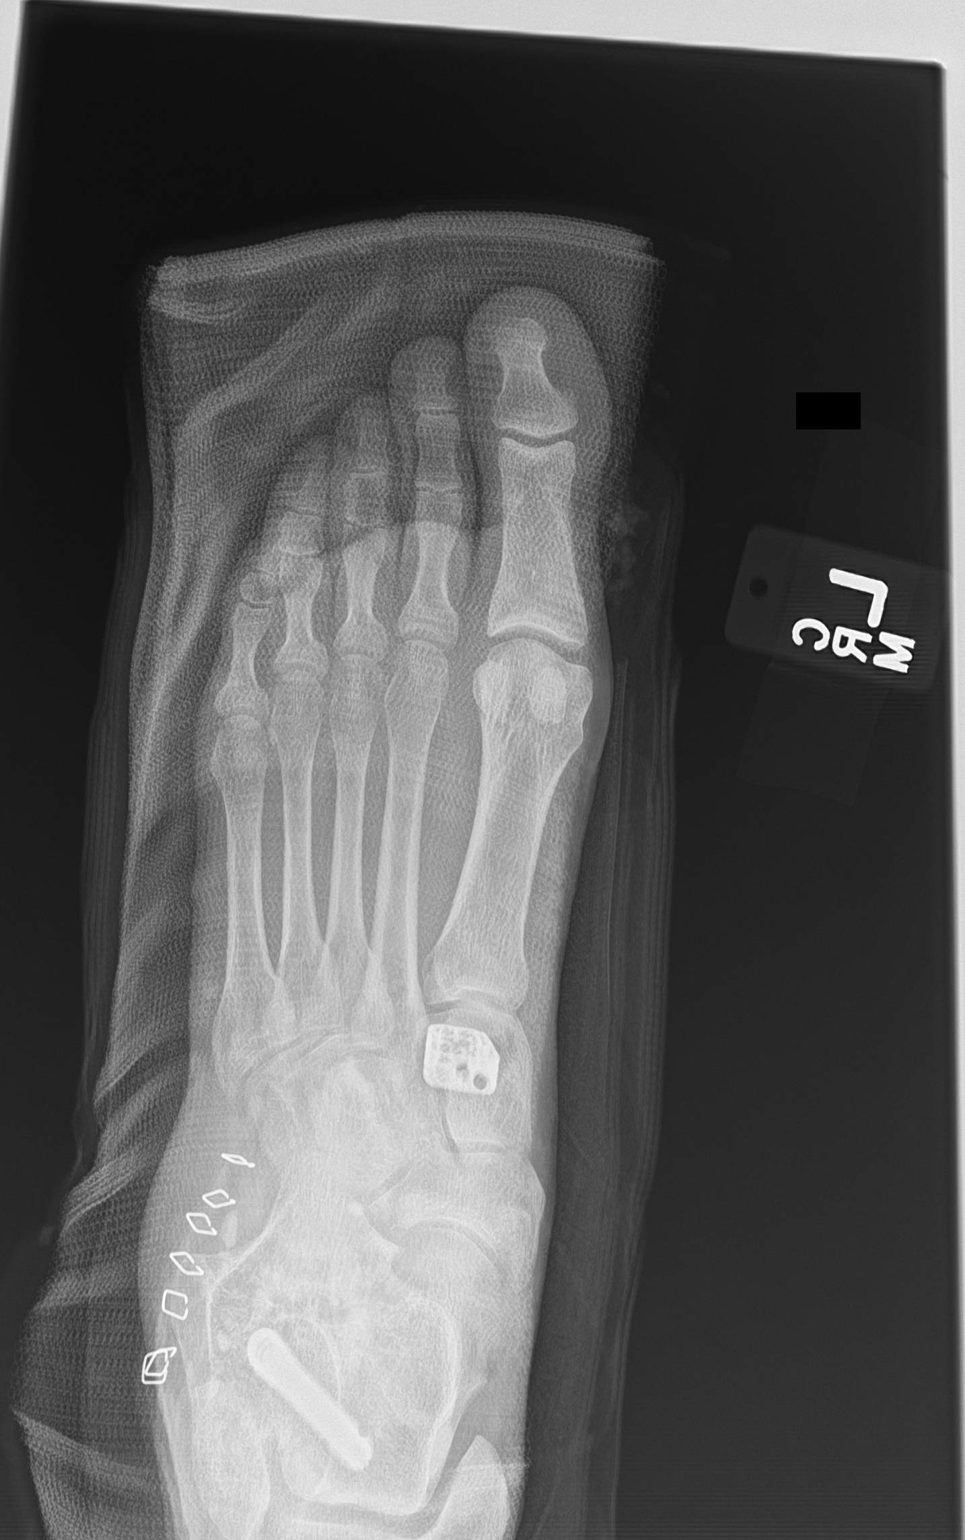

[foot lat]
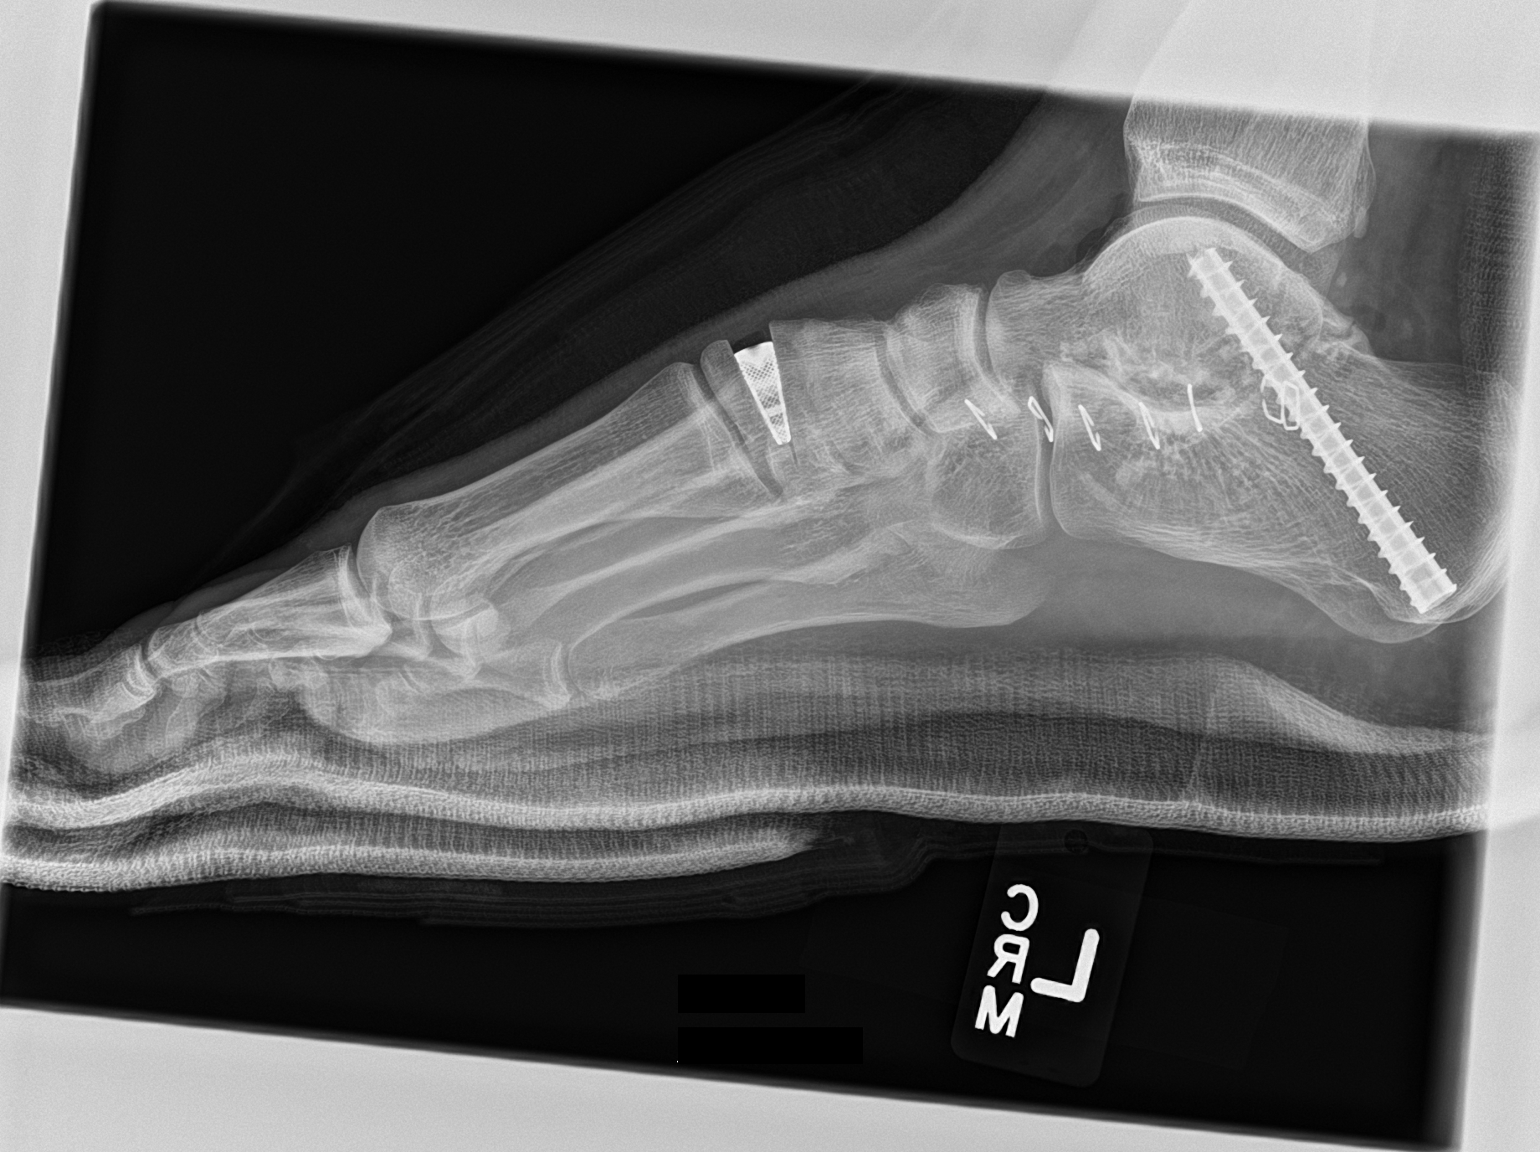

[2 of 2 positions shown; findings below may reference images not displayed]

FINDINGS: Fiberglass splint material obscures bone detail.

Osteotomy of first metatarsal with insertion of a prosthetic.

Large calcaneotalar screw present traversing the posterior subtalar
joint.

Bone graft material present at the subtalar joints.

Osseous mineralization normal.

No fracture or dislocation identified.
IMPRESSION: Subtalar joint fusion.

Prosthetic at the medial cuneiform.

## 2018-04-23 IMAGING — RF DG FOOT COMPLETE 3+V*L*
1 series · 4 of 4 positions shown · non-contrast
Comparison: 08/23/2016

CLINICAL DATA: LEFT subtalar fusion

EXAM:
LEFT FOOT - COMPLETE 3+ VIEW

[Series 1: run · 4 of 4 slices shown]
[im 1/4]
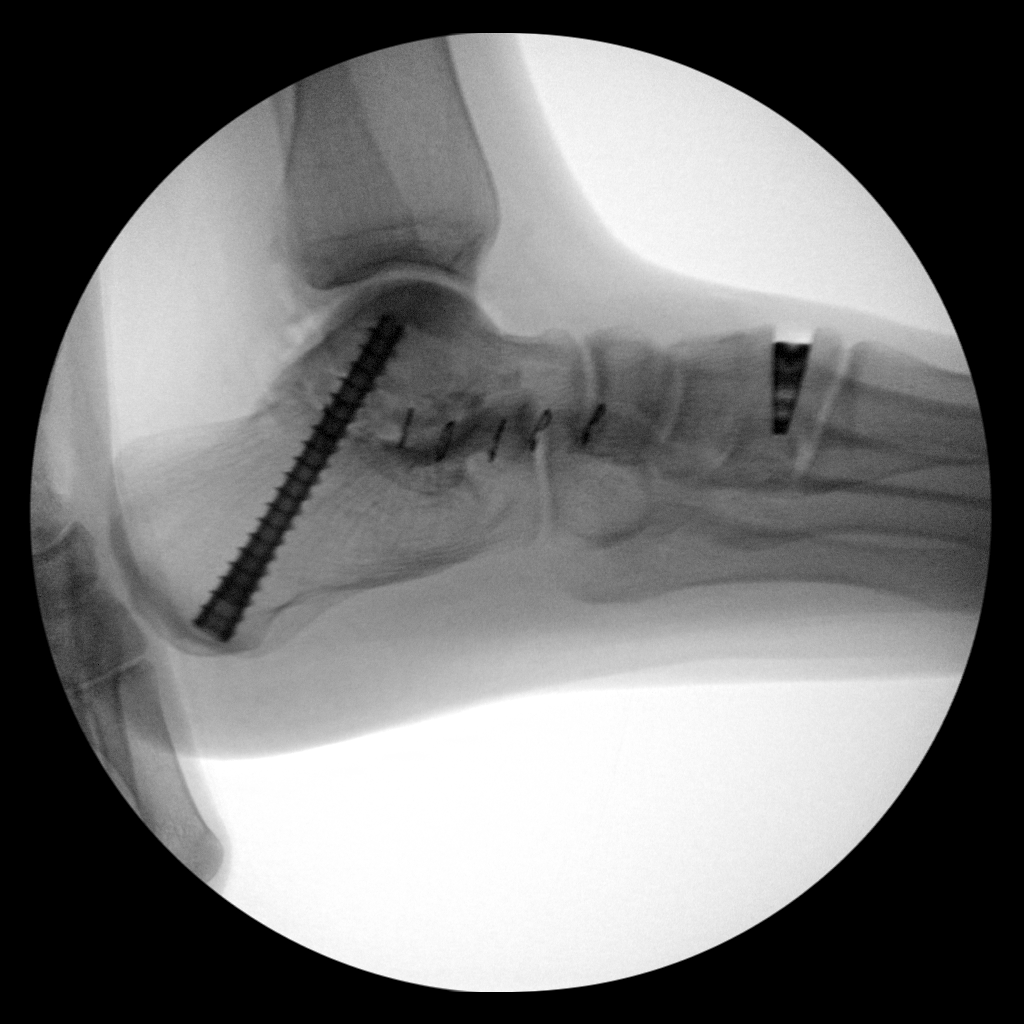
[im 2/4]
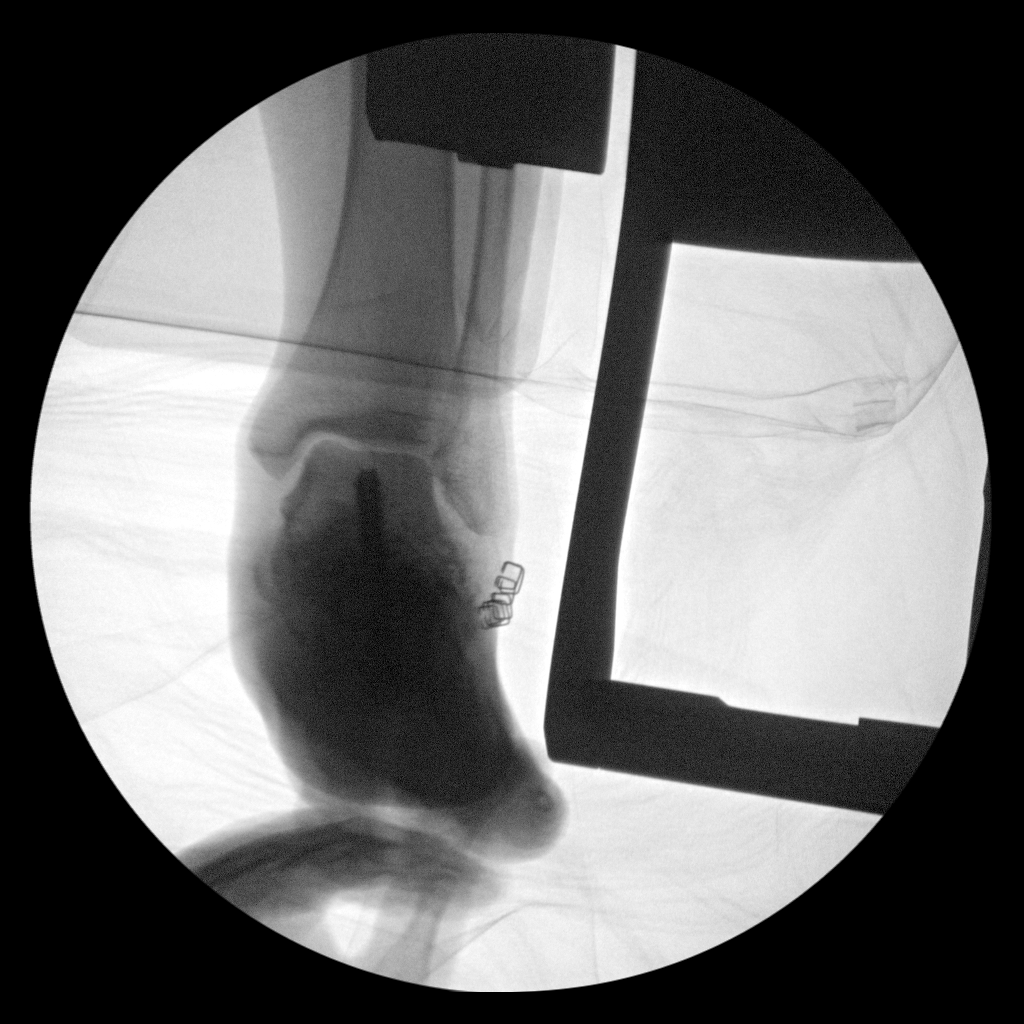
[im 3/4]
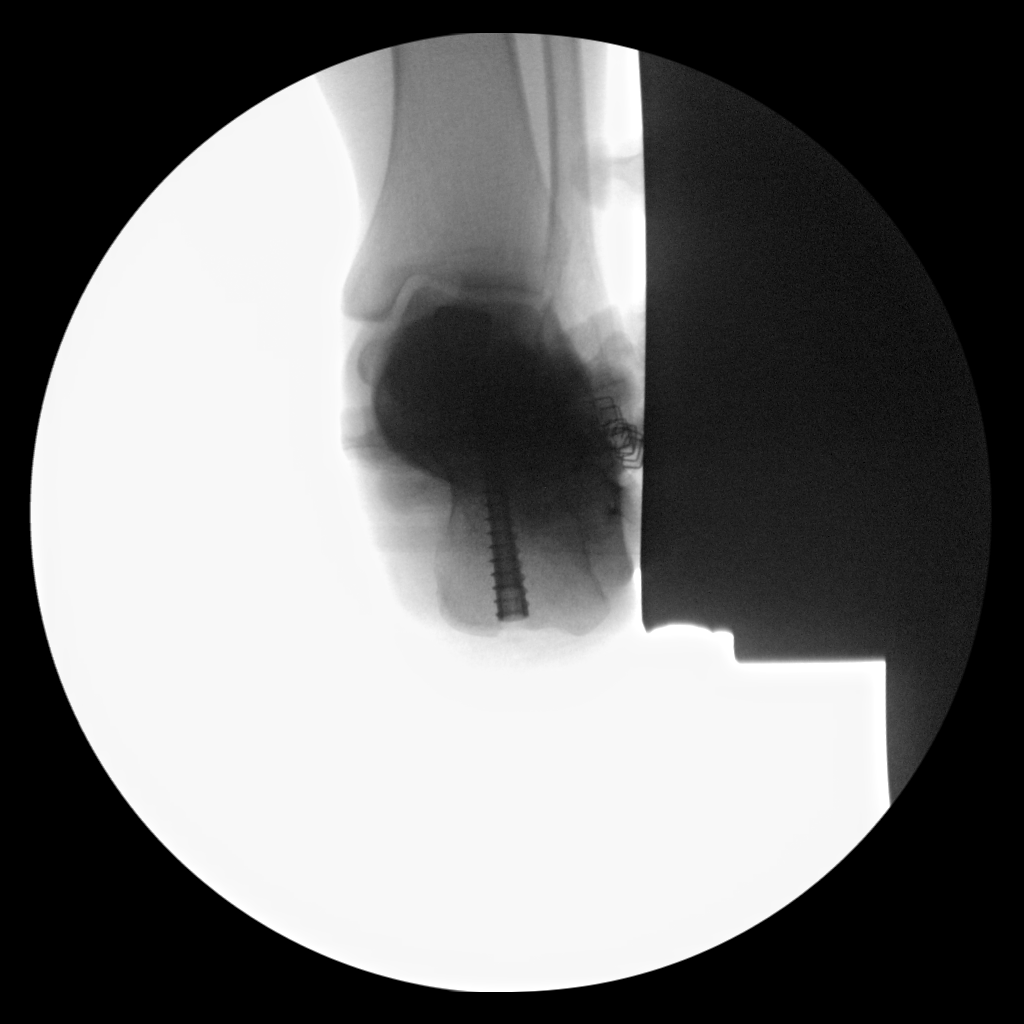
[im 4/4]
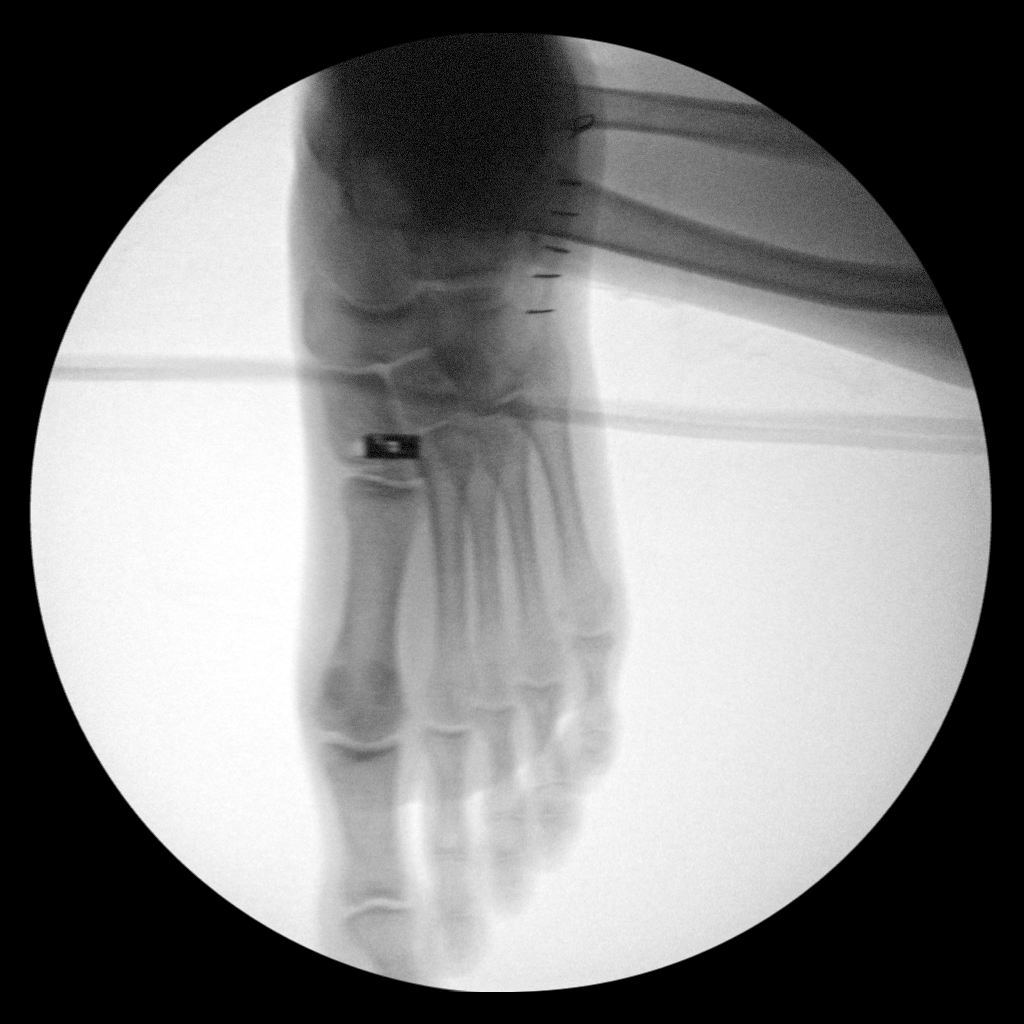

[4 of 4 positions shown; findings below may reference images not displayed]

FLUOROSCOPY TIME:  1 minutes 34 seconds

Fluoroscopic dose: not provided

Images obtained: 5
FINDINGS: Five digital C-arm fluoroscopic images obtained intraoperatively are
submitted.

Lateral skin clips.

Screw present at the hindfoot compatible with subtalar fusion
traversing the posterior subtalar joint.

Prosthesis identified at the medial cuneiform.
IMPRESSION: Posterior subtalar fusion with additional prosthetic noted at the
medial cuneiform.

## 2018-04-24 DIAGNOSIS — O09813 Supervision of pregnancy resulting from assisted reproductive technology, third trimester: Secondary | ICD-10-CM | POA: Diagnosis not present

## 2018-04-24 DIAGNOSIS — O99283 Endocrine, nutritional and metabolic diseases complicating pregnancy, third trimester: Secondary | ICD-10-CM | POA: Diagnosis not present

## 2018-04-24 DIAGNOSIS — Z3A33 33 weeks gestation of pregnancy: Secondary | ICD-10-CM | POA: Diagnosis not present

## 2018-05-11 ENCOUNTER — Encounter (HOSPITAL_COMMUNITY): Payer: Self-pay | Admitting: *Deleted

## 2018-05-11 ENCOUNTER — Inpatient Hospital Stay (HOSPITAL_COMMUNITY)
Admission: RE | Admit: 2018-05-11 | Discharge: 2018-05-11 | Disposition: A | Discharge status: Home / Self Care | Payer: BLUE CROSS/BLUE SHIELD | Source: Ambulatory Visit | Attending: Obstetrics and Gynecology | Admitting: Obstetrics and Gynecology

## 2018-05-11 DIAGNOSIS — Z23 Encounter for immunization: Secondary | ICD-10-CM | POA: Diagnosis not present

## 2018-05-11 DIAGNOSIS — E039 Hypothyroidism, unspecified: Secondary | ICD-10-CM | POA: Insufficient documentation

## 2018-05-11 DIAGNOSIS — O98313 Other infections with a predominantly sexual mode of transmission complicating pregnancy, third trimester: Secondary | ICD-10-CM | POA: Insufficient documentation

## 2018-05-11 DIAGNOSIS — O99343 Other mental disorders complicating pregnancy, third trimester: Secondary | ICD-10-CM | POA: Diagnosis not present

## 2018-05-11 DIAGNOSIS — O99283 Endocrine, nutritional and metabolic diseases complicating pregnancy, third trimester: Secondary | ICD-10-CM | POA: Insufficient documentation

## 2018-05-11 DIAGNOSIS — Z3685 Encounter for antenatal screening for Streptococcus B: Secondary | ICD-10-CM | POA: Diagnosis not present

## 2018-05-11 DIAGNOSIS — Z3689 Encounter for other specified antenatal screening: Secondary | ICD-10-CM

## 2018-05-11 DIAGNOSIS — Z3A36 36 weeks gestation of pregnancy: Secondary | ICD-10-CM | POA: Diagnosis not present

## 2018-05-11 DIAGNOSIS — A6000 Herpesviral infection of urogenital system, unspecified: Secondary | ICD-10-CM | POA: Insufficient documentation

## 2018-05-11 DIAGNOSIS — F329 Major depressive disorder, single episode, unspecified: Secondary | ICD-10-CM | POA: Diagnosis not present

## 2018-05-11 DIAGNOSIS — O09813 Supervision of pregnancy resulting from assisted reproductive technology, third trimester: Secondary | ICD-10-CM | POA: Diagnosis not present

## 2018-05-11 DIAGNOSIS — O368330 Maternal care for abnormalities of the fetal heart rate or rhythm, third trimester, not applicable or unspecified: Secondary | ICD-10-CM | POA: Diagnosis not present

## 2018-05-11 NOTE — MAU Note (Signed)
Pt sent from MD office for monitoring, baseline FHR low, ? Decelerations.  For further monitoring & U/S per pt.  Pt thinks she may be having some mild uc's, denies bleeding or LOF.  Reports good fetal movement.

## 2018-05-11 NOTE — MAU Provider Note (Signed)
History     Chief Complaint  Patient presents with  . EFM   Patient is a G1P0 at [redacted]w[redacted]d sent to MAU from office after scheduled visit, FHT's noted to have low baseline of 110 and minimal accels. Patient reports active FM, no pain, feels some BH ctx today, no LOF or VB.  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 Hx of depression stable on Prozac. Husband JP here with her and supportive.    OB History    Gravida  1   Para      Term      Preterm      AB      Living        SAB      TAB      Ectopic      Multiple      Live Births              Past Medical History:  Diagnosis Date  . Anxiety   . Depression   . Heart palpitations   . Hypothyroidism     Past Surgical History:  Procedure Laterality Date  . BUNIONECTOMY    . In vitro     X 3 , 2013, 2015, 2017  . Laproscopy of uterus    . TRIPLE SUBTALAR FUSION Left 09/15/2016   Procedure: SUBTALAR FUSION, COTTON OSTEOTOMY, GASTROC NEMIUS RECESS;  Surgeon: Vivi Barrack, DPM;  Location: [facility] OR;  Service: Podiatry;  Laterality: Left;    No family history on file.  Social History   Tobacco Use  . Smoking status: Never Smoker  . Smokeless tobacco: Never Used  Substance Use Topics  . Alcohol use: Not Currently    Comment: Ocassionally  . Drug use: No    Allergies:  Allergies  Allergen Reactions  . Metronidazole Other (See Comments)    Neck felt hot, and got a little feverish after taking it for about 4 days or so.  . Nickel Rash    Current meds: PNV, levothyroxine, Valtrex, Prozac  ROS Physical Exam     Physical Exam: Blood pressure 122/85, pulse 92, temperature 98.2 F (36.8 C), temperature source Oral, resp. rate 18, height 5\' 1"  (1.549 m), weight 76.7 kg, SpO2 100 %. General: NAD Heart: RRR, no murmurs Lungs: CTA b/l  Abd: Soft, NT, EFW 6-7 lbs, vertex  Ext: no edema Neuro: DTRs  normal     ED Course  Procedures   NST FHT 115 baseline, moderate variability, + accels 15x15, no decels Ctx q 2-4 min, mild, no pain  A/P: G1 at [redacted]w[redacted]d  Reactive NST, ANFT reassuring  Will d/c home to F/U in office next week. [facility] instructions reviewed, labor precautions given.    Neta Mends, CNM, MSN 05/11/2018, 2:34 PM

## 2018-05-15 DIAGNOSIS — O1203 Gestational edema, third trimester: Secondary | ICD-10-CM | POA: Diagnosis not present

## 2018-05-15 DIAGNOSIS — O368330 Maternal care for abnormalities of the fetal heart rate or rhythm, third trimester, not applicable or unspecified: Secondary | ICD-10-CM | POA: Diagnosis not present

## 2018-05-15 DIAGNOSIS — Z3A36 36 weeks gestation of pregnancy: Secondary | ICD-10-CM | POA: Diagnosis not present

## 2018-05-15 DIAGNOSIS — O09813 Supervision of pregnancy resulting from assisted reproductive technology, third trimester: Secondary | ICD-10-CM | POA: Diagnosis not present

## 2018-05-22 DIAGNOSIS — O99283 Endocrine, nutritional and metabolic diseases complicating pregnancy, third trimester: Secondary | ICD-10-CM | POA: Diagnosis not present

## 2018-05-22 DIAGNOSIS — Z3A37 37 weeks gestation of pregnancy: Secondary | ICD-10-CM | POA: Diagnosis not present

## 2018-05-31 DIAGNOSIS — O09813 Supervision of pregnancy resulting from assisted reproductive technology, third trimester: Secondary | ICD-10-CM | POA: Diagnosis not present

## 2018-05-31 DIAGNOSIS — R030 Elevated blood-pressure reading, without diagnosis of hypertension: Secondary | ICD-10-CM | POA: Diagnosis not present

## 2018-05-31 DIAGNOSIS — Z3A39 39 weeks gestation of pregnancy: Secondary | ICD-10-CM | POA: Diagnosis not present

## 2018-06-03 ENCOUNTER — Encounter (HOSPITAL_COMMUNITY): Payer: Self-pay | Admitting: *Deleted

## 2018-06-03 ENCOUNTER — Inpatient Hospital Stay (HOSPITAL_COMMUNITY)
Admission: AD | Admit: 2018-06-03 | Discharge: 2018-06-06 | DRG: 806 | Disposition: A | Discharge status: Home / Self Care | Payer: BLUE CROSS/BLUE SHIELD | Attending: Obstetrics and Gynecology | Admitting: Obstetrics and Gynecology

## 2018-06-03 DIAGNOSIS — O9081 Anemia of the puerperium: Secondary | ICD-10-CM | POA: Diagnosis not present

## 2018-06-03 DIAGNOSIS — O9832 Other infections with a predominantly sexual mode of transmission complicating childbirth: Secondary | ICD-10-CM | POA: Diagnosis not present

## 2018-06-03 DIAGNOSIS — O99344 Other mental disorders complicating childbirth: Secondary | ICD-10-CM | POA: Diagnosis not present

## 2018-06-03 DIAGNOSIS — O99284 Endocrine, nutritional and metabolic diseases complicating childbirth: Secondary | ICD-10-CM | POA: Diagnosis present

## 2018-06-03 DIAGNOSIS — Z2882 Immunization not carried out because of caregiver refusal: Secondary | ICD-10-CM | POA: Diagnosis not present

## 2018-06-03 DIAGNOSIS — F329 Major depressive disorder, single episode, unspecified: Secondary | ICD-10-CM | POA: Diagnosis not present

## 2018-06-03 DIAGNOSIS — Z3A36 36 weeks gestation of pregnancy: Secondary | ICD-10-CM

## 2018-06-03 DIAGNOSIS — A6000 Herpesviral infection of urogenital system, unspecified: Secondary | ICD-10-CM | POA: Diagnosis present

## 2018-06-03 DIAGNOSIS — Z3A39 39 weeks gestation of pregnancy: Secondary | ICD-10-CM | POA: Diagnosis not present

## 2018-06-03 DIAGNOSIS — O139 Gestational [pregnancy-induced] hypertension without significant proteinuria, unspecified trimester: Secondary | ICD-10-CM | POA: Diagnosis present

## 2018-06-03 DIAGNOSIS — E039 Hypothyroidism, unspecified: Secondary | ICD-10-CM | POA: Diagnosis present

## 2018-06-03 DIAGNOSIS — Z23 Encounter for immunization: Secondary | ICD-10-CM | POA: Diagnosis not present

## 2018-06-03 DIAGNOSIS — O134 Gestational [pregnancy-induced] hypertension without significant proteinuria, complicating childbirth: Secondary | ICD-10-CM | POA: Diagnosis not present

## 2018-06-03 DIAGNOSIS — D62 Acute posthemorrhagic anemia: Secondary | ICD-10-CM | POA: Diagnosis not present

## 2018-06-03 DIAGNOSIS — O1205 Gestational edema, complicating the puerperium: Secondary | ICD-10-CM | POA: Diagnosis not present

## 2018-06-03 DIAGNOSIS — Z3689 Encounter for other specified antenatal screening: Secondary | ICD-10-CM

## 2018-06-03 LAB — COMPREHENSIVE METABOLIC PANEL
ALT: 12 U/L (ref 0–44)
ANION GAP: 9 (ref 5–15)
AST: 22 U/L (ref 15–41)
Albumin: 2.6 g/dL — ABNORMAL LOW (ref 3.5–5.0)
Alkaline Phosphatase: 117 U/L (ref 38–126)
BUN: 13 mg/dL (ref 6–20)
CHLORIDE: 106 mmol/L (ref 98–111)
CO2: 22 mmol/L (ref 22–32)
Calcium: 8.8 mg/dL — ABNORMAL LOW (ref 8.9–10.3)
Creatinine, Ser: 0.93 mg/dL (ref 0.44–1.00)
GFR calc non Af Amer: >60 mL/min (ref >60)
Glucose, Bld: 91 mg/dL (ref 70–99)
POTASSIUM: 4.1 mmol/L (ref 3.5–5.1)
SODIUM: 137 mmol/L (ref 135–145)
Total Bilirubin: 0.4 mg/dL (ref 0.3–1.2)
Total Protein: 6 g/dL — ABNORMAL LOW (ref 6.5–8.1)

## 2018-06-03 LAB — TYPE AND SCREEN
ABO/RH(D): A POS
Antibody Screen: NEGATIVE

## 2018-06-03 LAB — CBC
HCT: 40.4 % (ref 36.0–46.0)
HEMOGLOBIN: 14 g/dL (ref 12.0–15.0)
MCH: 31.2 pg (ref 26.0–34.0)
MCHC: 34.7 g/dL (ref 30.0–36.0)
MCV: 90 fL (ref 80.0–100.0)
NRBC: 0 % (ref 0.0–0.2)
PLATELETS: 205 10*3/uL (ref 150–400)
RBC: 4.49 MIL/uL (ref 3.87–5.11)
RDW: 13.1 % (ref 11.5–15.5)
WBC: 13.1 10*3/uL — AB (ref 4.0–10.5)

## 2018-06-03 LAB — LACTATE DEHYDROGENASE: LDH: 192 U/L (ref 98–192)

## 2018-06-03 LAB — URIC ACID: URIC ACID, SERUM: 3.9 mg/dL (ref 2.5–7.1)

## 2018-06-03 MED ORDER — ONDANSETRON HCL 4 MG/2ML IJ SOLN: 4.0000 mg | Freq: Four times a day (QID) | INTRAMUSCULAR | Status: DC | PRN

## 2018-06-03 MED ORDER — FENTANYL 2.5 MCG/ML BUPIVACAINE 1/10 % EPIDURAL INFUSION (WH - ANES)
INTRAMUSCULAR | Status: AC
  Filled 2018-06-03: qty 100

## 2018-06-03 MED ORDER — LACTATED RINGERS IV SOLN
500.0000 mL | Freq: Once | INTRAVENOUS | Status: AC
  Administered 2018-06-03: 500 mL via INTRAVENOUS

## 2018-06-03 MED ORDER — OXYTOCIN 40 UNITS IN LACTATED RINGERS INFUSION - SIMPLE MED: 2.5000 [IU]/h | INTRAVENOUS | Status: DC

## 2018-06-03 MED ORDER — OXYTOCIN BOLUS FROM INFUSION
500.0000 mL | Freq: Once | INTRAVENOUS | Status: AC
  Administered 2018-06-04: 500 mL via INTRAVENOUS

## 2018-06-03 MED ORDER — ACETAMINOPHEN 325 MG PO TABS: 650.0000 mg | ORAL_TABLET | ORAL | Status: DC | PRN

## 2018-06-03 MED ORDER — LIDOCAINE HCL (PF) 1 % IJ SOLN
30.0000 mL | INTRAMUSCULAR | Status: DC | PRN
  Filled 2018-06-03: qty 30

## 2018-06-03 MED ORDER — LACTATED RINGERS IV SOLN
INTRAVENOUS | Status: DC
  Administered 2018-06-04: 01:00:00 via INTRAVENOUS

## 2018-06-03 MED ORDER — SOD CITRATE-CITRIC ACID 500-334 MG/5ML PO SOLN: 30.0000 mL | ORAL | Status: DC | PRN

## 2018-06-03 MED ORDER — OXYTOCIN 40 UNITS IN LACTATED RINGERS INFUSION - SIMPLE MED
1.0000 m[IU]/min | INTRAVENOUS | Status: DC
  Administered 2018-06-04: 2 m[IU]/min via INTRAVENOUS
  Filled 2018-06-03: qty 1000

## 2018-06-03 MED ORDER — LACTATED RINGERS IV SOLN
500.0000 mL | INTRAVENOUS | Status: DC | PRN
  Administered 2018-06-04 (×2): 500 mL via INTRAVENOUS

## 2018-06-03 MED ORDER — TERBUTALINE SULFATE 1 MG/ML IJ SOLN: 0.2500 mg | Freq: Once | INTRAMUSCULAR | Status: DC | PRN

## 2018-06-03 MED ORDER — EPHEDRINE 5 MG/ML INJ: 10.0000 mg | INTRAVENOUS | Status: DC | PRN

## 2018-06-03 MED ORDER — PHENYLEPHRINE 40 MCG/ML (10ML) SYRINGE FOR IV PUSH (FOR BLOOD PRESSURE SUPPORT): 80.0000 ug | PREFILLED_SYRINGE | INTRAVENOUS | Status: DC | PRN

## 2018-06-03 MED ORDER — MISOPROSTOL 50MCG HALF TABLET
50.0000 ug | ORAL_TABLET | ORAL | Status: DC
  Administered 2018-06-03: 50 ug via BUCCAL
  Filled 2018-06-03: qty 1

## 2018-06-03 MED ORDER — FENTANYL 2.5 MCG/ML BUPIVACAINE 1/10 % EPIDURAL INFUSION (WH - ANES)
14.0000 mL/h | INTRAMUSCULAR | Status: DC | PRN
  Administered 2018-06-04: 14 mL/h via EPIDURAL

## 2018-06-03 MED ORDER — DIPHENHYDRAMINE HCL 50 MG/ML IJ SOLN: 12.5000 mg | INTRAMUSCULAR | Status: DC | PRN

## 2018-06-03 MED ORDER — PHENYLEPHRINE 40 MCG/ML (10ML) SYRINGE FOR IV PUSH (FOR BLOOD PRESSURE SUPPORT)
PREFILLED_SYRINGE | INTRAVENOUS | Status: AC
  Filled 2018-06-03: qty 10

## 2018-06-03 NOTE — Progress Notes (Signed)
S:  Reports increased intensity and frequency of ctx. Requesting epidural for pain mgmt.   O:  VS: BP (!) 137/98   Pulse 93   Temp 98.7 F (37.1 C) (Oral)   Resp 18   Ht 5\' 1"  (1.549 m)   Wt 76.6 kg   BMI 31.93 kg/m         Re-assuring maternal-fetal status        Ctx: 2-3 minutes / mod-strong per palpation        Cervix : 6/90/-1        Membranes: SROM and clear  A:  1.37 y.o.G4P0030at [redacted]w[redacted]d 2. Active labor 3. Gestational HTN -no neuraxial S/S  4. Re-assuring FHT's -continuous FM w/ epidural 5.GBSnegative  P: May have epidural DC Cytotec Active management  -Pitocin augmentation after comfortable w/ epidural AMTSL   Roma Schanz  06/03/2018, 11:10 PM

## 2018-06-03 NOTE — Progress Notes (Signed)
S:  Cervical balloon spont dislodged 1 hr after placement. Breathing w/ ctx and coping well. Reports increased pain w/ ctx. Doula and spouse providing support. Requesting hydrotherapy now.   O:  VS: BP (!) 144/94   Pulse 84   Temp 98.7 F (37.1 C) (Oral)   Resp 18   Ht 5\' 1"  (1.549 m)   Wt 76.6 kg   BMI 31.93 kg/m         FHT : 125        Ctx: every 3-4 minutes lasting 45-60 sec/ mod strength        Cervix : deferred        Membranes: SROM, clear fluid  A: 1. 37 y.o. G4P0030 at [redacted]w[redacted]d 2. SROM x19 hrs             -clear 3. Gestational HTN             -no neuraxial S/S, no proteinuria, baseline PIH labs nml   4. Re-assuring FHT's             -intermittent FM 5. GBS negative  P:  May labor in tub Continue routine L&D orders SVE prior to next sched Cytotec dose Call for SBP>160 or DBP>110    Roma Schanz  06/03/2018, 9:55 PM

## 2018-06-03 NOTE — H&P (Addendum)
OB ADMISSION/ HISTORY & PHYSICAL:  Admission Date: 06/03/2018  4:32 PM  Admit Diagnosis:   Cassandra Kennedy is a 37 y.o. female presenting for SROM and ctx every 5 min. Spent early labor at home supported by doula Marchelle Folks) and spouse (JP). Desires minimal intervention w/ hydrotherapy.   Prenatal History: G4 P0030   EDC : 06/05/2018 IVF pregnancy  Prenatal care at [facility] since 20+[redacted] weeks gestation  Prenatal course complicated by Recurrent SAB, Hypothyroidism, Depression, HSV-2, elevated BP and edema of BLE in 3rd trimester  Prenatal Labs: ABO, Rh:  A+ Antibody:  Negative Rubella:  Immune  RPR:   Non-reactive HBsAg:   Negative HIV:   Negative GBS:   Negative 1 hr Glucola : Passed Anat U/S: nml female anat, anterior placenta, subj nml AF  TdaP: Admin 05/11/18 Requests influenza vax PP  Medical / Surgical History :  Past medical history:  Past Medical History:  Diagnosis Date  . Anxiety   . Depression   . Heart palpitations   . Hypothyroidism      Past surgical history:  Past Surgical History:  Procedure Laterality Date  . BUNIONECTOMY    . In vitro     X 3 , 2013, 2015, 2017  . Laproscopy of uterus    . TRIPLE SUBTALAR FUSION Left 09/15/2016   Procedure: SUBTALAR FUSION, COTTON OSTEOTOMY, GASTROC NEMIUS RECESS;  Surgeon: Vivi Barrack, DPM;  Location: [facility] OR;  Service: Podiatry;  Laterality: Left;     Family History: No family history on file.   Social History:  reports that she has never smoked. She has never used smokeless tobacco. She reports that she drank alcohol. She reports that she does not use drugs.   Allergies: Metronidazole and Nickel    Current Medications at time of admission:  Medications Prior to Admission  Medication Sig Dispense Refill Last Dose  . FLUoxetine (PROZAC) 40 MG capsule Take 40 mg by mouth at bedtime.    Taking  . levothyroxine (SYNTHROID, LEVOTHROID) 100 MCG tablet Take 100 mcg by mouth daily before  breakfast.   Taking  . Melatonin 1 MG TABS Take 1 mg by mouth at bedtime.    Taking      Review of Systems: Review of Systems  Constitutional: Negative.   Eyes: Negative.   Cardiovascular: Negative.   Gastrointestinal: Negative.   Genitourinary: Negative.   Neurological: Negative.    Physical Exam:    Vitals:   06/03/18 1733  BP: (!) 143/92  Pulse: 92  Resp: 15  Temp: 98.3 F (36.8 C)  TempSrc: Oral   General: AAO x3 Heart: RRR, no murmur Lungs: Clear bilat Abdomen: soft, gravid Extremities: 1+ pitting edema to RLE, trace edema to LLE Genitalia / VE: No lesions, 1.5/75/-3-SROM @ 0300 on 06/03/18 FHR: Baseline 120/ mod variability/ 15x15 accels x2 TOCO: irreg ctx, mild-mod per palpation  Labs:    CBC Latest Ref Rng & Units 06/03/2018 09/15/2016  WBC 4.0 - 10.5 K/uL 13.1(H) -  Hemoglobin 12.0 - 15.0 g/dL 16.1 09.6  Hematocrit 04.5 - 46.0 % 40.4 -  Platelets 150 - 400 K/uL 205 -   CMP Latest Ref Rng & Units 06/03/2018  Glucose 70 - 99 mg/dL 91  BUN 6 - 20 mg/dL 13  Creatinine 4.09 - 8.11 mg/dL 9.14  Sodium 782 - 956 mmol/L 137  Potassium 3.5 - 5.1 mmol/L 4.1  Chloride 98 - 111 mmol/L 106  CO2 22 - 32 mmol/L 22  Calcium 8.9 -  10.3 mg/dL 9.1(Y)  Total Protein 6.5 - 8.1 g/dL 6.0(L)  Total Bilirubin 0.3 - 1.2 mg/dL 0.4  Alkaline Phos 38 - 126 U/L 117  AST 15 - 41 U/L 22  ALT 0 - 44 U/L 12       Assessment: 1. 37 y.o. G4P0030 at [redacted]w[redacted]d 2. SROM x15 hrs  -clear 3. Gestational HTN  -no neuraxial S/S, no proteinuria, baseline PIH labs nml   4. Latent stage of labor 5. FHR category 1  -Reactive tracing then intermittent FM 6. GBS negative 7. Hypothyroidism  -stable on Synthroid mcg 8. HSV2  -suppressive therapy since 37+6 wks  -no lesions 9. Depression  -stable on Prozac    Plan:  Admit to BS Routine L&D orders Cervical balloon placed Cytotec -50 mg buccal Q4 hrs  Analgesia/anesthesia PRN  Active management Anticipate NSVB Desires hydrotherapy  and waterbirth Breastfeeding   Dr. Billy Coast  notified of admission / plan of care   Roma Schanz, SNM 06/03/2018, 5:03 PM

## 2018-06-04 ENCOUNTER — Inpatient Hospital Stay (HOSPITAL_COMMUNITY): Payer: BLUE CROSS/BLUE SHIELD | Admitting: Anesthesiology

## 2018-06-04 ENCOUNTER — Encounter (HOSPITAL_COMMUNITY): Payer: Self-pay

## 2018-06-04 DIAGNOSIS — O139 Gestational [pregnancy-induced] hypertension without significant proteinuria, unspecified trimester: Secondary | ICD-10-CM | POA: Diagnosis present

## 2018-06-04 LAB — CBC
HEMATOCRIT: 33.3 % — AB (ref 36.0–46.0)
Hemoglobin: 12.1 g/dL (ref 12.0–15.0)
MCH: 32.2 pg (ref 26.0–34.0)
MCHC: 36.3 g/dL — ABNORMAL HIGH (ref 30.0–36.0)
MCV: 88.6 fL (ref 80.0–100.0)
PLATELETS: 185 10*3/uL (ref 150–400)
RBC: 3.76 MIL/uL — AB (ref 3.87–5.11)
RDW: 13 % (ref 11.5–15.5)
WBC: 24.6 10*3/uL — ABNORMAL HIGH (ref 4.0–10.5)
nRBC: 0 % (ref 0.0–0.2)

## 2018-06-04 LAB — SYPHILIS: RPR W/REFLEX TO RPR TITER AND TREPONEMAL ANTIBODIES, TRADITIONAL SCREENING AND DIAGNOSIS ALGORITHM: RPR Ser Ql: NONREACTIVE

## 2018-06-04 LAB — ABO/RH: ABO/RH(D): A POS

## 2018-06-04 MED ORDER — ONDANSETRON HCL 4 MG/2ML IJ SOLN: 4.0000 mg | INTRAMUSCULAR | Status: DC | PRN

## 2018-06-04 MED ORDER — DIPHENHYDRAMINE HCL 25 MG PO CAPS: 25.0000 mg | ORAL_CAPSULE | Freq: Four times a day (QID) | ORAL | Status: DC | PRN

## 2018-06-04 MED ORDER — EPHEDRINE 5 MG/ML INJ: 10.0000 mg | INTRAVENOUS | Status: DC | PRN

## 2018-06-04 MED ORDER — ONDANSETRON HCL 4 MG PO TABS: 4.0000 mg | ORAL_TABLET | ORAL | Status: DC | PRN

## 2018-06-04 MED ORDER — MISOPROSTOL 200 MCG PO TABS
600.0000 ug | ORAL_TABLET | Freq: Once | ORAL | Status: AC
  Administered 2018-06-04: 600 ug via RECTAL

## 2018-06-04 MED ORDER — ACETAMINOPHEN 325 MG PO TABS
650.0000 mg | ORAL_TABLET | ORAL | Status: DC | PRN
  Administered 2018-06-04 – 2018-06-05 (×4): 650 mg via ORAL
  Filled 2018-06-04 (×4): qty 2

## 2018-06-04 MED ORDER — COCONUT OIL OIL
1.0000 "application " | TOPICAL_OIL | Status: DC | PRN
  Administered 2018-06-05: 1 via TOPICAL
  Filled 2018-06-04: qty 120

## 2018-06-04 MED ORDER — SENNOSIDES-DOCUSATE SODIUM 8.6-50 MG PO TABS
2.0000 | ORAL_TABLET | ORAL | Status: DC
  Administered 2018-06-04 – 2018-06-05 (×2): 2 via ORAL
  Filled 2018-06-04 (×2): qty 2

## 2018-06-04 MED ORDER — SIMETHICONE 80 MG PO CHEW: 80.0000 mg | CHEWABLE_TABLET | ORAL | Status: DC | PRN

## 2018-06-04 MED ORDER — MISOPROSTOL 200 MCG PO TABS
ORAL_TABLET | ORAL | Status: AC
  Filled 2018-06-04: qty 4

## 2018-06-04 MED ORDER — IBUPROFEN 600 MG PO TABS
600.0000 mg | ORAL_TABLET | Freq: Four times a day (QID) | ORAL | Status: DC
  Administered 2018-06-04 – 2018-06-06 (×8): 600 mg via ORAL
  Filled 2018-06-04 (×9): qty 1

## 2018-06-04 MED ORDER — PHENYLEPHRINE 40 MCG/ML (10ML) SYRINGE FOR IV PUSH (FOR BLOOD PRESSURE SUPPORT)
80.0000 ug | PREFILLED_SYRINGE | INTRAVENOUS | Status: DC | PRN
  Administered 2018-06-04: 80 ug via INTRAVENOUS

## 2018-06-04 MED ORDER — WITCH HAZEL-GLYCERIN EX PADS: 1.0000 "application " | MEDICATED_PAD | CUTANEOUS | Status: DC | PRN

## 2018-06-04 MED ORDER — DIBUCAINE 1 % RE OINT: 1.0000 "application " | TOPICAL_OINTMENT | RECTAL | Status: DC | PRN

## 2018-06-04 MED ORDER — MISOPROSTOL 200 MCG PO TABS
200.0000 ug | ORAL_TABLET | Freq: Once | ORAL | Status: AC
  Administered 2018-06-04: 200 ug via ORAL

## 2018-06-04 MED ORDER — LACTATED RINGERS IV SOLN: 500.0000 mL | Freq: Once | INTRAVENOUS | Status: DC

## 2018-06-04 MED ORDER — BENZOCAINE-MENTHOL 20-0.5 % EX AERO
1.0000 "application " | INHALATION_SPRAY | CUTANEOUS | Status: DC | PRN
  Administered 2018-06-05: 1 via TOPICAL
  Filled 2018-06-04: qty 56

## 2018-06-04 MED ORDER — LIDOCAINE HCL (PF) 1 % IJ SOLN
INTRAMUSCULAR | Status: DC | PRN
  Administered 2018-06-04: 3 mL via EPIDURAL
  Administered 2018-06-04: 2 mL via EPIDURAL
  Administered 2018-06-04: 5 mL via EPIDURAL

## 2018-06-04 MED ORDER — PRENATAL MULTIVITAMIN CH
1.0000 | ORAL_TABLET | Freq: Every day | ORAL | Status: DC
  Administered 2018-06-04 – 2018-06-06 (×3): 1 via ORAL
  Filled 2018-06-04 (×3): qty 1

## 2018-06-04 NOTE — Progress Notes (Signed)
S: Reports relief from ctx pain w/ epidural. Continue support from doula and spouse  O:  VS: BP 124/82   Pulse 89   Temp 97.8 F (36.6 C) (Oral)   Resp 16   Ht 5\' 1"  (1.549 m)   Wt 76.6 kg   SpO2 100%   BMI 31.93 kg/m           FHR : baseline 120 / variability mod / accelerations present /prolonged  Deceleration x1        Toco:  Palpation:ctx every 2-4  minutes / 50 sec / mod-strong        Cervix : Dilation: 7 Effacement (%): 90 Cervical Position: Middle Station: 0 Presentation: Vertex Exam by:: Rhea Pink          Membranes: SROM, clear  A:  37 y.o. G4P0030 [redacted]w[redacted]d Active labor Cat II FHR surveillance  -returned to Cat I after intrauterine resuscitation Epidural effective for pain mngt Gest HTN  -normotensive after epidual P:  Pitocin augmentation Anticipate NSVB AMTSL    Roma Schanz  06/04/2018, 12:55 AM

## 2018-06-04 NOTE — Anesthesia Procedure Notes (Signed)
Epidural Patient location during procedure: OB Start time: 06/03/2018 11:56 PM End time: 06/04/2018 12:12 AM  Staffing Anesthesiologist: Marcene Duos, MD Performed: anesthesiologist   Preanesthetic Checklist Completed: patient identified, site marked, surgical consent, pre-op evaluation, timeout performed, IV checked, risks and benefits discussed and monitors and equipment checked  Epidural Patient position: sitting Prep: site prepped and draped and DuraPrep Patient monitoring: continuous pulse ox and blood pressure Approach: midline Location: L4-L5 Injection technique: LOR air  Needle:  Needle type: Tuohy  Needle gauge: 17 G Needle length: 9 cm and 9 Needle insertion depth: 7 cm Catheter type: closed end flexible Catheter size: 19 Gauge Catheter at skin depth: 13 (12-->13) cm Test dose: negative  Assessment Events: blood not aspirated, injection not painful, no injection resistance, negative IV test and no paresthesia

## 2018-06-04 NOTE — Anesthesia Preprocedure Evaluation (Signed)
Anesthesia Evaluation  Patient identified by MRN, date of birth, ID band Patient awake    Reviewed: Allergy & Precautions, Patient's Chart, lab work & pertinent test results  Airway Mallampati: II  TM Distance: >3 FB     Dental   Pulmonary neg pulmonary ROS,    Pulmonary exam normal        Cardiovascular hypertension, Normal cardiovascular exam     Neuro/Psych negative neurological ROS     GI/Hepatic negative GI ROS, Neg liver ROS,   Endo/Other  Hypothyroidism   Renal/GU negative Renal ROS     Musculoskeletal   Abdominal   Peds  Hematology negative hematology ROS (+)   Anesthesia Other Findings   Reproductive/Obstetrics (+) Pregnancy                             Lab Results  Component Value Date   WBC 13.1 (H) 06/03/2018   HGB 14.0 06/03/2018   HCT 40.4 06/03/2018   MCV 90.0 06/03/2018   PLT 205 06/03/2018    Anesthesia Physical Anesthesia Plan  ASA: II  Anesthesia Plan: Epidural   Post-op Pain Management:    Induction:   PONV Risk Score and Plan: Treatment may vary due to age or medical condition  Airway Management Planned: Natural Airway  Additional Equipment:   Intra-op Plan:   Post-operative Plan:   Informed Consent: I have reviewed the patients History and Physical, chart, labs and discussed the procedure including the risks, benefits and alternatives for the proposed anesthesia with the patient or authorized representative who has indicated his/her understanding and acceptance.     Plan Discussed with:   Anesthesia Plan Comments:         Anesthesia Quick Evaluation

## 2018-06-04 NOTE — Progress Notes (Signed)
S:  Comfortable w/ epidural, reports increase in pressure w/ ctx  O:  VS: BP 122/67   Pulse 72   Temp 98.5 F (36.9 C) (Oral)   Resp 18   Ht 5\' 1"  (1.549 m)   Wt 76.6 kg   SpO2 100%   BMI 31.93 kg/m         FHR : baseline 115 / variability mod / accelerations pressent / early decelerations        Toco: contractions every 3-4 minutes / mod via palpation, TOCO adjusted        Cervix : Dilation: 8 Effacement (%): 90 Cervical Position: Middle Station: 0 Presentation: Vertex Exam by:: Rhea Pink          Membranes: SROM, clear        Pitocin 29mU/min  A:  37 y.o. G4P0030 [redacted]w[redacted]d Active labor FHR category I Gest HTN     -normotensive  P:  Active management Anticipate NSVB   Cassandra Kennedy  06/04/2018, 3:18 AM

## 2018-06-05 DIAGNOSIS — D62 Acute posthemorrhagic anemia: Secondary | ICD-10-CM | POA: Diagnosis not present

## 2018-06-05 LAB — CBC
HCT: 23.8 % — ABNORMAL LOW (ref 36.0–46.0)
HEMOGLOBIN: 8.6 g/dL — AB (ref 12.0–15.0)
MCH: 31.9 pg (ref 26.0–34.0)
MCHC: 36.1 g/dL — ABNORMAL HIGH (ref 30.0–36.0)
MCV: 88.1 fL (ref 80.0–100.0)
PLATELETS: 137 10*3/uL — AB (ref 150–400)
RBC: 2.7 MIL/uL — AB (ref 3.87–5.11)
RDW: 13.2 % (ref 11.5–15.5)
WBC: 12.2 10*3/uL — ABNORMAL HIGH (ref 4.0–10.5)
nRBC: 0 % (ref 0.0–0.2)

## 2018-06-05 MED ORDER — HYDROCHLOROTHIAZIDE 25 MG PO TABS
25.0000 mg | ORAL_TABLET | Freq: Once | ORAL | Status: AC
  Administered 2018-06-05: 25 mg via ORAL
  Filled 2018-06-05: qty 1

## 2018-06-05 MED ORDER — INFLUENZA VAC SPLIT QUAD 0.5 ML IM SUSY
0.5000 mL | PREFILLED_SYRINGE | INTRAMUSCULAR | Status: AC
  Administered 2018-06-06: 0.5 mL via INTRAMUSCULAR
  Filled 2018-06-05: qty 0.5

## 2018-06-05 MED ORDER — MAGNESIUM OXIDE 400 (241.3 MG) MG PO TABS
400.0000 mg | ORAL_TABLET | Freq: Every day | ORAL | Status: DC
  Administered 2018-06-05 – 2018-06-06 (×2): 400 mg via ORAL
  Filled 2018-06-05 (×3): qty 1

## 2018-06-05 MED ORDER — POLYSACCHARIDE IRON COMPLEX 150 MG PO CAPS
150.0000 mg | ORAL_CAPSULE | Freq: Every day | ORAL | Status: DC
  Administered 2018-06-05 – 2018-06-06 (×2): 150 mg via ORAL
  Filled 2018-06-05 (×2): qty 1

## 2018-06-05 NOTE — Anesthesia Postprocedure Evaluation (Signed)
Anesthesia Post Note  Patient: Cassandra Kennedy  Procedure(s) Performed: AN AD HOC LABOR EPIDURAL     Patient location during evaluation: Mother Baby Anesthesia Type: Epidural Level of consciousness: awake and alert Pain management: pain level controlled Vital Signs Assessment: post-procedure vital signs reviewed and stable Respiratory status: spontaneous breathing Cardiovascular status: stable Postop Assessment: no headache, patient able to bend at knees, no backache, no apparent nausea or vomiting, epidural receding, adequate PO intake and able to ambulate Anesthetic complications: no    Last Vitals:  Vitals:   06/04/18 2005 06/05/18 0629  BP: 130/66 128/85  Pulse: 90 86  Resp: 16 16  Temp: 36.8 C 36.7 C  SpO2:      Last Pain:  Vitals:   06/05/18 0629  TempSrc: Oral  PainSc: 2    Pain Goal: Patients Stated Pain Goal: 7 (06/03/18 1728)               Salome Arnt

## 2018-06-05 NOTE — Lactation Note (Signed)
This note was copied from a baby's chart. Lactation Consultation Note  Patient Name: Cassandra Kennedy [id number] Date: 06/05/2018 Reason for consult: Follow-up assessment;Term;1st time breastfeeding;Primapara  P1 mother whose infant is now 19 hours old.  Mother has called for latch assistance  Positioned mother appropriately and assisted to latch in the football hold on the left breast without difficulty.  Demonstrated how to obtain and maintain a deep latch.  Mother denied pain with latching and baby began rhythmic sucking.  Demonstrated breast compressions and proper positioning for baby.    Encouraged to feed 8-12 times/24 hours or sooner if he shows feeding cues.  Mother will continue hand expression before/after feedings.  She will call for latch assistance as needed.    When I left the room 12 minutes later baby was still feeding.  Father present.  RN will bring coconut oil for mother.    Maternal Data Formula Feeding for Exclusion: No Has patient been taught Hand Expression?: Yes Does the patient have breastfeeding experience prior to this delivery?: No  Feeding Feeding Type: Breast Fed  LATCH Score Latch: Grasps breast easily, tongue down, lips flanged, rhythmical sucking.  Audible Swallowing: A few with stimulation  Type of Nipple: Everted at rest and after stimulation  Comfort (Breast/Nipple): Soft / non-tender  Hold (Positioning): Assistance needed to correctly position infant at breast and maintain latch.  LATCH Score: 8  Interventions Interventions: Breast feeding basics reviewed;Assisted with latch;Skin to skin;Breast massage;Hand express;Position options;Support pillows;Adjust position;Breast compression  Lactation Tools Discussed/Used WIC Program: No   Consult Status Consult Status: Follow-up Date: 06/06/18 Follow-up type: In-patient    Cassandra Kennedy R Cassandra Kennedy 06/05/2018, 4:08 PM

## 2018-06-05 NOTE — Progress Notes (Signed)
PPD 1 SVD sulcus and labial laceration repairs  S:  Reports feeling well - wants to go home as soon as possible             Tolerating po/ No nausea or vomiting             Bleeding is light             Pain controlled with motrin             Up ad lib / ambulatory / voiding QS  Newborn Breast  O:  VS: BP 128/85 (BP Location: Right Arm)   Pulse 86   Temp 98 F (36.7 C) (Oral)   Resp 16   Ht 5\' 1"  (1.549 m)   Wt 76.6 kg   SpO2 99% Comment: RA  Breastfeeding? Unknown   BMI 31.93 kg/m    BP: 128/85 - 130/66 - 122/83 - 131/94 - 129/82   LABS:             Recent Labs    06/04/18 0623 06/05/18 0407  WBC 24.6* 12.2*  HGB 12.1 8.6*  PLT 185 137*               Blood type: --/--/A POS, A POS Performed at [facility], [address], [place], [place] [address]  (10/19 1910)                           Physical Exam:             Alert and oriented X3  Abdomen: soft, non-tender, non-distended              Fundus: firm, non-tender, Ueven  Perineum: mild edema - ice pack in place  Lochia: light- moderate  Extremities: right > L (2+ pedal right ankle) edema, no calf pain or tenderness  A: PPD # 1 vaginal and labial laceration repair             ABL anemia              Dependent edema with labile hypertension without evidence of PEC  Doing well - stable status  P: Routine post partum orders  Start iron and magnesium             HCTZ for edema - monitor BP             Plan DC in am  Marlinda Mike CNM, MSN, [facility] 06/05/2018, 9:52 AM

## 2018-06-05 NOTE — Lactation Note (Signed)
This note was copied from a baby's chart. Lactation Consultation Note  Patient Name: Cassandra Kennedy [id number] Date: 06/05/2018 Reason for consult: Initial assessment;Primapara;1st time breastfeeding;Term  P1 mother whose infant is now 36 hours old  Baby was sleeping in mother's arms as I arrived.  He had just recently finished breast feeding.  Mother stated that "it hurts" when he feeds.  I explained that breast feeding should not hurt but she should feel a tug at the breast.  I offered to assist with latching and the next feeding and mother agreed to call me.   Encouraged to feed 8-12 times/24 hours or sooner if baby shows feeding cues.  Reviewed feeding cues.  Mother is familiar with hand expression but cannot express any drops at this time.  I will review this with her when I assist at the next feeding.  Colostrum container provided and milk storage times reviewed.  Since baby is sleeping now I will wait until she requests assistance.    Mom made aware of O/P services, breastfeeding support groups, community resources, and our phone # for post-discharge questions. Mother has Lanolin at bedside; I suggested coconut oil instead and asked RN to bring her some.  RN updated.   Maternal Data Formula Feeding for Exclusion: No Has patient been taught Hand Expression?: Yes Does the patient have breastfeeding experience prior to this delivery?: No  Feeding Feeding Type: Breast Fed  LATCH Score                   Interventions    Lactation Tools Discussed/Used WIC Program: No   Consult Status Consult Status: Follow-up Date: 06/06/18 Follow-up type: In-patient    Cassandra Kennedy 06/05/2018, 2:47 PM

## 2018-06-05 NOTE — Progress Notes (Signed)
MOB was referred for history of depression/anxiety. * Referral screened out by Clinical Social Worker because none of the following criteria appear to apply: ~ History of anxiety/depression during this pregnancy, or of post-partum depression following prior delivery. ~ Diagnosis of anxiety and/or depression within last 3 years OR * MOB's symptoms currently being treated with medication and/or therapy. Please contact the Clinical Social Worker if needs arise, by MOB request, or if MOB scores greater than 9/yes to question 10 on Edinburgh Postpartum Depression Screen.  Christyana Corwin, LCSW Clinical Social Worker  [facility]  [phone]  

## 2018-06-06 ENCOUNTER — Other Ambulatory Visit: Payer: Self-pay

## 2018-06-06 MED ORDER — FLUOXETINE HCL 20 MG PO CAPS: 20.0000 mg | ORAL_CAPSULE | Freq: Three times a day (TID) | ORAL | 0 refills | Status: DC

## 2018-06-06 MED ORDER — LEVOTHYROXINE SODIUM 100 MCG PO TABS: 100.0000 ug | ORAL_TABLET | Freq: Every day | ORAL | 3 refills | Status: DC

## 2018-06-06 MED ORDER — IBUPROFEN 600 MG PO TABS: 600.0000 mg | ORAL_TABLET | Freq: Four times a day (QID) | ORAL | 0 refills | Status: AC

## 2018-06-06 MED ORDER — POLYSACCHARIDE IRON COMPLEX 150 MG PO CAPS: 150.0000 mg | ORAL_CAPSULE | Freq: Every day | ORAL | 6 refills | Status: DC

## 2018-06-06 NOTE — Lactation Note (Signed)
This note was copied from a baby's chart. Lactation Consultation Note  Patient Name: Cassandra Kennedy [id number] Date: 06/06/2018 Reason for consult: Follow-up assessment;Term;Primapara;1st time breastfeeding;Infant weight loss  65 hours old FT female who is now being partially BF and formula fed by his mother, she's a P1 had some difficulties latching on and baby is now at 9% weight loss.  MD ordered supplementation with Similac 22 calorie formula, 5 F feeding tube and syringe already brought to the room.  RN paged LC to assist with feeding infant, mom voiced she's got sore nipples but there's no history of any lip/tongue tied in the family, LC suggested parents to check with baby's pediatrician for any potential problems. At this point, mom doesn't feel like supplementing baby at the breast due to breast status, LC recommended doing finger feeding with a syringe in order to give her nipples a chance to heal. Mom will pump in the mean time to protect her milk supply.  LC assisted with finger feeding baby with a 11F feeding tube, also showed parents how to burp baby. Baby took 12 ml of Similac 22 calorie formula, parents aware of feeding/supplementation schedule. Reviewed prevention and treatment for sore nipples, and also discharge instructions and red flags on when to call baby's pediatrician.   Feeding plan:  1. Mom will pump every 3 hours and at least once at night, a total of 8 pumping sessions in 24 hours until her nipples heal (about 24-48 hours) 2. She'll supplement with her EBM first and then with Similac 22 calorie formula to complete the volumes required for baby's age 52. Once they heal, she'll put baby back to the breast aiming for 8-12 feedings STS in 24 hours and revise feeding plan with baby's pediatrician on next appt on Thursday October 24th, 2 days from now  Parents reported all questions and concerns were answered, they're both aware of LC OP services and will contact  PRN.  Maternal Data Formula Feeding for Exclusion: No Has patient been taught Hand Expression?: Yes Does the patient have breastfeeding experience prior to this delivery?: No  Feeding Feeding Type: Formula    Interventions Interventions: Breast feeding basics reviewed;Skin to skin;Breast massage;Hand express;Breast compression;Position options  Lactation Tools Discussed/Used Tools: 11F feeding tube / Syringe WIC Program: No   Consult Status Consult Status: Complete Date: 06/06/18 Follow-up type: Call as needed    Cassandra Kennedy 06/06/2018, 1:55 PM

## 2018-06-06 NOTE — Progress Notes (Signed)
PPD #2, SVD, sulcus and labial repair, baby boy  S:  Reports feeling sore, but ready to be discharged home  Denies HA, visual changes, RUQ/epigastric pain              Tolerating po/ No nausea or vomiting / Denies dizziness or SOB             Bleeding is moderate             Pain controlled with Motrin and Tylenol             Up ad lib / ambulatory / voiding QS  Newborn breast feeding - reports nipple pain and bruising on right nipple. Planning to f/u with lactation.   O:               VS: BP 126/86 (BP Location: Left Arm)   Pulse 93   Temp 98 F (36.7 C) (Oral)   Resp 18   Ht 5\' 1"  (1.549 m)   Wt 76.6 kg   SpO2 100%   Breastfeeding? Unknown   BMI 31.93 kg/m   Vitals:   06/05/18 0629 06/05/18 1430 06/05/18 2344 06/06/18 0544  BP: 128/85 125/90 119/77 126/86  Pulse: 86 96 87 93  Resp: 16 16 20 18   Temp: 98 F (36.7 C) 99.1 F (37.3 C) 98.2 F (36.8 C) 98 F (36.7 C)  TempSrc: Oral Oral Oral Oral  SpO2:  99%  100%  Weight:      Height:        LABS:              Recent Labs    06/04/18 0623 06/05/18 0407  WBC 24.6* 12.2*  HGB 12.1 8.6*  PLT 185 137*               Blood type: --/--/A POS, A POS Performed at [facility], [address], [place], [place] [address]  (10/19 1910)  Rubella:     Immune                Tdap UTD              Physical Exam:             Alert and oriented X3  Lungs: Clear and unlabored  Heart: regular rate and rhythm / no murmurs  Abdomen: soft, non-tender, non-distended              Fundus: firm, non-tender, U-2  Perineum: well approximated sulcus and labial repair   Lochia: small, no clots   Extremities: +1 pedal/ tibial edema, no calf pain or tenderness    A: PPD # 2, SVD  Sulcus and labial laceration repair  ABL Anemia   Dependent edema   Gestational Hypertension without evidence of PEC   Hypothyroidism - on Synthroid  Depression - stable on Prozac 60mg   Doing well - stable status  P: Routine post partum  orders  Flu vaccine given  See lactation prior to discharge  Discharge home  WOB discharge book given, instructions and warning s/s reviewed   F/u in 1 week for BP check with [facility] nurse, then 6 week PP visit   Carlean Jews, MSN, CNM [facility]

## 2018-06-06 NOTE — Lactation Note (Signed)
This note was copied from a baby's chart. Lactation Consultation Note: Mom reports baby fed about 1 hour ago and is asleep in dad's arms. Reports nipples are sore. Has crack on right nipple and bruise on areola. Left nipple is pink but intact. Has Medela gels on them. Comfort gels given with instructions. RN had given her shells- reviewed how to use- put them on now. Ped talked with them about supplementing due to 9% weight loss. Reviewed feeding tube/syringe setup, use and cleaning of parts. Parents agreeable to try it. Encouraged mom to page for assist when baby wakes for next feeding. Mom has Spectra pump for home. Encouraged to pump at least 4-6 times/day to promote milk supply and can use as supplement. Reviewed our phone number, OP appointments and BFSG as resources for support after DC.  Patient Name: Cassandra Kennedy [name] ZOXWR'U Date: 06/06/2018 Reason for consult: Follow-up assessment   Maternal Data Formula Feeding for Exclusion: No Has patient been taught Hand Expression?: Yes Does the patient have breastfeeding experience prior to this delivery?: No  Feeding    LATCH Score                   Interventions Interventions: Breast feeding basics reviewed;Comfort gels;Coconut oil;Expressed milk  Lactation Tools Discussed/Used WIC Program: No   Consult Status Consult Status: Follow-up Date: 06/06/18 Follow-up type: In-patient    Pamelia Hoit 06/06/2018, 11:08 AM

## 2018-06-06 NOTE — Discharge Summary (Signed)
Obstetric Discharge Summary   Patient Name: Cassandra Kennedy DOB: 08-18-1980 MRN: [id number]  Date of Admission: 06/03/2018 Date of Discharge: 06/06/2018 Date of Delivery: 06/04/18 Gestational Age at Delivery: [redacted]w[redacted]d  Primary OB: [facility] - CNM management   Antepartum complications:  Recurrent SAB, Hypothyroidism, Depression, HSV-2, elevated BP and edema of BLE in 3rd trimester Prenatal Labs:  ABO, Rh:  A+ Antibody:  Negative Rubella:  Immune  RPR:   Non-reactive HBsAg:   Negative HIV:   Negative GBS:   Negative 1 hr Glucola : Passed Anat U/S: nml female anat, anterior placenta, subj nml AF  TdaP: Admin 05/11/18 Requests influenza vax PP  Admitting Diagnosis: Active labor at term   Secondary Diagnoses: Patient Active Problem List   Diagnosis Date Noted  . Postpartum care following vaginal delivery (10/20) 06/05/2018  . Acute blood loss anemia 06/05/2018  . SVD (10/20) 06/04/2018  . Right labial and sulcus laceration 06/04/2018  . Gestational htn w/o significant proteinuria, unsp trimester 06/04/2018  . Hallux limitus, left 05/25/2017  . Acquired posterior equinus, left 07/15/2016  . 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 Congenital pes planus of right foot 07/15/2016  . 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 In vitro fertilization 03/27/2014  . Female infertility 09/13/2011    Augmentation: Pitocin Complications: None  Date of Delivery: 06/04/18 Delivered By: Cassandra Kennedy, SNM/Cassandra Kennedy, CNM  Delivery Type: spontaneous vaginal delivery Anesthesia: epidural Placenta: sponatneous Laceration: labial and sulcus Episiotomy: none  Newborn Data: Live born female  Birth Weight: 6 lb 12.3 oz (3070 g) APGAR: 7, 8  Newborn Delivery   Birth date/time:  06/[birth year]/2018 04:29:00 Delivery type:  Vaginal, Spontaneous        Hospital/Postpartum Course  (Vaginal Delivery): Pt. Admitted with SROM and gestational hypertension. She progressed with Pitocin.  See notes for details. Patient had an uncomplicated postpartum course.  By time of discharge on PPD#2, her pain was controlled on oral pain medications; she had appropriate lochia and was ambulating, voiding without difficulty and tolerating regular diet.  Blood pressures stable. She was deemed stable for discharge to home.    Labs: CBC Latest Ref Rng & Units 06/05/2018 06/04/2018 06/03/2018  WBC 4.0 - 10.5 K/uL 12.2(H) 24.6(H) 13.1(H)  Hemoglobin 12.0 - 15.0 g/dL 4.0(J) 81.1 91.4  Hematocrit 36.0 - 46.0 % 23.8(L) 33.3(L) 40.4  Platelets 150 - 400 K/uL 137(L) 185 205   Conflict (See Lab Report): A POS/A POS Performed at [facility], [address], [place], [place] [address]   Physical exam:  BP 126/86 (BP Location: Left Arm)   Pulse 93   Temp 98 F (36.7 C) (Oral)   Resp 18   Ht 5\' 1"  (1.549 m)   Wt 76.6 kg   SpO2 100%   Breastfeeding? Unknown   BMI 31.93 kg/m  General: alert and no distress Pulm: normal respiratory effort Lochia: appropriate Abdomen: soft, NT Uterine Fundus: firm, below umbilicus Perineum: healing well, no significant erythema, no significant edema Incision: c/d/i, healing well, no significant drainage, no dehiscence, no significant erythema Extremities: No evidence of DVT seen on physical exam. +1 pedal/ tibial extremity edema.   Disposition: stable, discharge to home Baby Feeding: breast milk  Baby Disposition: home with mom  Contraception: did not discuss  Rh Immune globulin given: N/A Rubella vaccine given: Immune Tdap vaccine given in AP or PP setting: UTD Flu vaccine given in AP or PP setting: given 06/06/18   Plan:  Cassandra Kennedy was discharged to home  in good condition. Follow-up appointment at [facility] in 1 week for BP check and 6 weeks for PP visit  Discharge Instructions: Per After Visit Summary. Refer to After Visit Summary and [facility] discharge booklet  Activity: Advance as tolerated. Pelvic rest for 6 weeks.    Diet: Regular, Heart Healthy Discharge Medications: Allergies as of 06/06/2018      Reactions   Metronidazole Other (See Comments)   Neck felt hot, and got a little feverish after taking it for about 4 days or so.   Nickel Rash      Medication List    STOP taking these medications   valACYclovir 1000 MG tablet Commonly known as:  VALTREX     TAKE these medications   FLUoxetine 20 MG capsule Commonly known as:  PROZAC Take 1 capsule (20 mg total) by mouth 3 (three) times daily. What changed:  when to take this   ibuprofen 600 MG tablet Commonly known as:  ADVIL,MOTRIN Take 1 tablet (600 mg total) by mouth every 6 (six) hours.   iron polysaccharides 150 MG capsule Commonly known as:  NIFEREX Take 1 capsule (150 mg total) by mouth daily. Start taking on:  06/07/2018   levothyroxine 100 MCG tablet Commonly known as:  SYNTHROID, LEVOTHROID Take 1 tablet (100 mcg total) by mouth daily before breakfast.      Outpatient follow up:   Signed:  Carlean Jews, MSN, CNM [facility]

## 2018-06-20 ENCOUNTER — Ambulatory Visit: Payer: Self-pay

## 2018-06-20 DIAGNOSIS — O9122 Nonpurulent mastitis associated with the puerperium: Secondary | ICD-10-CM | POA: Diagnosis not present

## 2018-06-20 NOTE — Lactation Note (Signed)
This note was copied from a baby's chart. 06/20/2018  Name: Cassandra Kennedy MRN: [id number] Date of Birth: 06/04/2016 Gestational Age: Gestational Age: [redacted]w[redacted]d Birth Weight: 108.3 oz Weight today:    Today's Weight 7 pounds 8.5 ounces (3416 grams) with clean newborn diaper  Infant presents today with mom and dad for feeding assessment.   Infant has gained 612 grams in the last 12 days with an average daily weight gain of 51 grams a day.   Mom reports infant is feeding well on demand. Mom reports some nipple tenderness with feedings. Mom is pumping some and offering bottle when her nipples need a break. Infant is feeding with the [place] tomo bottle and parents report some choking, enc parents to try the Dr. Theora Gianotti bottle that they have at home. Mom is able to pump what infant needs now.   Mom on Levothyroxine and to have thyroid levels checked at 6 week appt.   Infant latched to both breasts, mom reports some mild pinching with feedings. Mom has infant well supported and worked with her on deepening latch. Mom has longer nipples and we discussed importance of deeper latch.   Infant with thick labial frenulum that inserts at the bottom of the gum ridge. Upper lip with slight tightness and blanching with flanging, upper lip flanged well on the breast. Infant with good tongue mobility and extension. Infant with strong suckle on gloved finger. Nipple slightly compressed post feeding. Mom is pumping to rest nipple as needed.   Mom has had one plugged duct and resolved with changing position for feeding. Enc mom to rotate positions with each feeding. Mom currently on Antibiotics due to Mastitis symptoms, breast was hot to the touch and painful. Mom reports she did did not have a fever. Mom is taking Cefalexin. Mom aware to call back if nipples are not healing and if burning continues to her nipples. Nipples look intact and non reddened today. Burning she is experiencing was present prior to ATB.    Mom reports some pain to nipple post feeding, reviewed vasospasms and trying warm heat to nipple post feeding to see it is helps. Burning lasts only with nipple erection and then goes away. Mom reports the nipple pain is much better than it was.   Parents have 37 month old foster child at home. Mom has good support from husband.   Infant to follow up with Ped at 2 months. [facility] was out on Friday. Mom aware of BF Support Groups. Mom to follow up with Lactation as needed.   Parents report that all questions have been answered at this time.   General Information: Mother's reason for visit: Feeding assessment Consult: Initial Lactation consultant: Noralee Stain RN,IBCLC Breastfeeding experience: going pretty well, pain with feedings for most feeding Maternal medical conditions: Thyroid, Pregnancy induced hypertension, Infertility(Hypothyroidism-Levothyroxine, IVF pregnancy) Maternal medications: Pre-natal vitamin, Other(Fish Oil, Levothyroxine, Fluoxetine)  Breastfeeding History: Frequency of breast feeding: every 2-4 hours Duration of feeding: 15-30 mintues  Supplementation: Supplement method: bottle(Com[place]omo) Brand: Similac Formula volume: 3 ounces Formula frequency: 0-1 x a day Total formula volume per day: 0-3 ounces Breast milk volume: 3 ounces Breast milk frequency: 1-2 x a day Total breast milk volume per day: 3-6 ounces Pump type: Spectra Pump frequency: 2-3 x a day Pump volume: 3.5 ounces  Infant Output Assessment: Voids per 24 hours: 8 Urine color: Clear yellow Stools per 24 hours: 5 Stool color: Yellow  Breast Assessment: Breast: Soft, Compressible Nipple: Erect Pain level: 4(with feeding)  Pain interventions: Bra, Coconut oil  Feeding Assessment: Infant oral assessment: Variance Infant oral assessment comment: Infant with thick labial frenulum that inserts at the bottom of the gum ridge. Upper lip with slight tightness and blanching with flanging,  upper lip flanged well on the breast. Infant with good tongue mobility and extension. Infant with strong suckle on gloved finger. Nipple slightly compressed post feeding. Positioning: Cross cradle(left breast) Latch: 1 - Repeated attempts needed to sustain latch, nipple held in mouth throughout feeding, stimulation needed to elicit sucking reflex. Audible swallowing: 1 - A few with stimulation Type of nipple: 2 - Everted at rest and after stimulation Comfort: 1 - Filling, red/small blisters or bruises, mild/mod discomfort Hold: 2 - No assistance needed to correctly position infant at breast LATCH score: 7 Latch assessment: Shallow(helped mom deepen latch) Lips flanged: Yes Suck assessment: Displays both   Pre-feed weight: 3416 grams Post feed weight: 3428 grams Amount transferred: 12 ml Amount supplemented: 0  Additional Feeding Assessment: Infant oral assessment: Variance Infant oral assessment comment: see above Positioning: Cross cradle(rigth breast) Latch: 2 - Grasps breast easily, tongue down, lips flanged, rhythmical sucking. Audible swallowing: 2 - Spontaneous and intermittent Type of nipple: 2 - Everted at rest and after stimulation Comfort: 1 - Filling, red/small blisters or bruises, mild/mod discomfort Hold: 2 - No assistance needed to correctly position infant at breast LATCH score: 9 Latch assessment: Deep Lips flanged: Yes Suck assessment: Displays both   Pre-feed weight: 3428 grams Post feed weight: 3444 grams Amount transferred: 16 ml Amount supplemented: 0  Totals: Total amount transferred: 28 ml Total supplement given: 0 Total amount pumped post feed: did not feed   Plan:  1. Offer breast with feeding cues as mom and infant want 2. Keep infant awake at the breast as needed 3. Massage/compress breast with feeding 4. Rotate positions with each feeding 5. Offer infant a bottle of pumped breast milk if not latching to the breast 6. Use a slower flow nipple  such as Dr. Theora Gianotti for bottle feeding 7. Use the paced bottle feeding method when using the bottle (video on [email or web address]) 8. Infant needs about 64-85 ml (2-3 ounces) for 8 feedings a day or 510-680 ml (17-23 ounces) in 24 hours.  9. Continue pumping 2-3 x a day to protect milk supply and especially when infant receiving a bottle. Use your double electric breast pump and pump to empty, usually about 15-20 minutes 10. Keep up the good work 11. Thank you for allowing me to assist you today 12. Please call with any questions/concerns as needed [phone] 13. Follow up with Lactation as needed    Ed Blalock RN, IBCLC                                                     Silas Flood Osaze Hubbert 06/20/2018, 10:11 AM

## 2018-06-22 ENCOUNTER — Ambulatory Visit: Payer: Self-pay

## 2018-06-22 DIAGNOSIS — N6459 Other signs and symptoms in breast: Secondary | ICD-10-CM | POA: Diagnosis not present

## 2018-06-22 DIAGNOSIS — O9122 Nonpurulent mastitis associated with the puerperium: Secondary | ICD-10-CM | POA: Diagnosis not present

## 2018-06-22 DIAGNOSIS — N644 Mastodynia: Secondary | ICD-10-CM | POA: Diagnosis not present

## 2018-06-22 NOTE — Lactation Note (Signed)
This note was copied from a baby's chart. Mom reports today with dad for assistance with Mastitis and plugged ducts at request of her OB. Infant present but slept through assessment.   Mom reports she has had a few times where she has gone 3-5  Hours between pumpings.   Mom has been being treated for Mastitis for the last 5 days. She is experiencing breast redness, swelling and decreased milk supply today. She is only able to pump 1/2 ounce out of the right breast and a few gtts out of the left breast..  Mom has attempted warm compresses, warm shower, vibration, pumping, dangle feeding, massage and Ibuprofen to help resolve plugs prior to visit today. Mom reports infant will only feed for a few minutes and become frustrated, enc mom to offer 1 ounce in the bottle and then offer the breast. Enc mom to feed infant at the breast when he is tired. Infant is being supplemented with EBM and formula as needed.   Mom with diffuse redness to both breasts today. Outer aspects of the breasts are firm with knots noted. Warm compress applied, reverse pressure performed and dad used vibrator on the breast, Mom pumped with manual pump and hand expressed and milk was noted to flow better than she has been able to make happen at home. Mom and dad were able to express about 3-4 ml. Mom able to express about 15 ml from the right breast with the manual pump.   Reviewed importance of repeating process every 2 hours for now until issues are resolved to prevent milk from drying up.   Mom has stopped Fenugreek for now. Mom saw OB today,  Antibiotics are being switched to Clindamycin today. Mom is taking Ibuprofen to assist with swelling. Mom has started taking Soy Lecithin yesterday and is taking 2 capsules (1200 mg each) every 5-6 hours.  Plan made with family. Mom to call with questions/concerns as needed.      Cassandra Kennedy 06/22/2018, 3:56 PM

## 2018-07-17 DIAGNOSIS — E039 Hypothyroidism, unspecified: Secondary | ICD-10-CM | POA: Diagnosis not present

## 2018-07-26 DIAGNOSIS — N926 Irregular menstruation, unspecified: Secondary | ICD-10-CM | POA: Diagnosis not present

## 2018-08-21 DIAGNOSIS — F411 Generalized anxiety disorder: Secondary | ICD-10-CM | POA: Diagnosis not present

## 2018-09-04 DIAGNOSIS — F411 Generalized anxiety disorder: Secondary | ICD-10-CM | POA: Diagnosis not present

## 2018-09-16 DIAGNOSIS — F331 Major depressive disorder, recurrent, moderate: Secondary | ICD-10-CM | POA: Diagnosis not present

## 2018-12-12 DIAGNOSIS — Z124 Encounter for screening for malignant neoplasm of cervix: Secondary | ICD-10-CM | POA: Diagnosis not present

## 2018-12-12 DIAGNOSIS — Z01419 Encounter for gynecological examination (general) (routine) without abnormal findings: Secondary | ICD-10-CM | POA: Diagnosis not present

## 2018-12-12 DIAGNOSIS — Z6827 Body mass index (BMI) 27.0-27.9, adult: Secondary | ICD-10-CM | POA: Diagnosis not present

## 2018-12-12 DIAGNOSIS — E039 Hypothyroidism, unspecified: Secondary | ICD-10-CM | POA: Diagnosis not present

## 2018-12-12 DIAGNOSIS — Z113 Encounter for screening for infections with a predominantly sexual mode of transmission: Secondary | ICD-10-CM | POA: Diagnosis not present

## 2018-12-12 DIAGNOSIS — Z1151 Encounter for screening for human papillomavirus (HPV): Secondary | ICD-10-CM | POA: Diagnosis not present

## 2018-12-21 DIAGNOSIS — D539 Nutritional anemia, unspecified: Secondary | ICD-10-CM | POA: Diagnosis not present

## 2018-12-21 DIAGNOSIS — Z131 Encounter for screening for diabetes mellitus: Secondary | ICD-10-CM | POA: Diagnosis not present

## 2018-12-21 DIAGNOSIS — Z0000 Encounter for general adult medical examination without abnormal findings: Secondary | ICD-10-CM | POA: Diagnosis not present

## 2018-12-21 DIAGNOSIS — E559 Vitamin D deficiency, unspecified: Secondary | ICD-10-CM | POA: Diagnosis not present

## 2018-12-21 DIAGNOSIS — E039 Hypothyroidism, unspecified: Secondary | ICD-10-CM | POA: Diagnosis not present

## 2018-12-27 DIAGNOSIS — F411 Generalized anxiety disorder: Secondary | ICD-10-CM | POA: Diagnosis not present

## 2018-12-29 DIAGNOSIS — F411 Generalized anxiety disorder: Secondary | ICD-10-CM | POA: Diagnosis not present

## 2019-01-02 ENCOUNTER — Encounter: Payer: Self-pay | Admitting: Podiatry

## 2019-01-02 ENCOUNTER — Ambulatory Visit (INDEPENDENT_AMBULATORY_CARE_PROVIDER_SITE_OTHER): Payer: BLUE CROSS/BLUE SHIELD

## 2019-01-02 ENCOUNTER — Ambulatory Visit (INDEPENDENT_AMBULATORY_CARE_PROVIDER_SITE_OTHER): Payer: BLUE CROSS/BLUE SHIELD | Admitting: Podiatry

## 2019-01-02 ENCOUNTER — Other Ambulatory Visit: Payer: Self-pay | Admitting: Podiatry

## 2019-01-02 ENCOUNTER — Other Ambulatory Visit: Payer: Self-pay

## 2019-01-02 VITALS — Temp 97.7°F

## 2019-01-02 DIAGNOSIS — M779 Enthesopathy, unspecified: Secondary | ICD-10-CM

## 2019-01-02 DIAGNOSIS — M7752 Other enthesopathy of left foot: Secondary | ICD-10-CM | POA: Diagnosis not present

## 2019-01-02 DIAGNOSIS — M79671 Pain in right foot: Secondary | ICD-10-CM

## 2019-01-02 DIAGNOSIS — M205X2 Other deformities of toe(s) (acquired), left foot: Secondary | ICD-10-CM | POA: Diagnosis not present

## 2019-01-11 NOTE — Progress Notes (Signed)
Subjective: 38 year old female presents the office today for evaluation of leftfoot pain on the first MPJ.  She stated that she has a lot of walking to the discomfort she is interested in having a steroid injection at this time.  She is continue range of motion exercises as well as wearing the orthotics and wearing supportive shoes.  Denies any recent injury or falls. Denies any systemic complaints such as fevers, chills, nausea, vomiting. No acute changes since last appointment, and no other complaints at this time.   Objective: AAO x3, NAD DP/PT pulses palpable bilaterally, CRT less than 3 seconds Tenderness to the left first MPJ.  There is mild restriction in dorsiflexion but there is no crepitation.  There is minimal swelling to the area there is no erythema or warmth.  There is no other areas of tenderness elicited at this time.  There is no tenderness the other surgical sites.  Scarring present along the gastrocnemius recession site. No open lesions or pre-ulcerative lesions.  No pain with calf compression, swelling, warmth, erythema  Assessment: Capsulitis right first MPJ.  Plan: -All treatment options discussed with the patient including all alternatives, risks, complications.  -X-rays were obtained reviewed.  Status post subtalar joint arthrodesis, Cotton osteotomy and first metatarsal osteotomy.  No evidence of acute fracture. -Steroid injection performed.  See procedure note below. -Discussed modifying orthotic to further offload the first MPJ. -Continue Voltaren gel as needed -Discussed steroid injection for the scarring of the calf.  Will consider this at further appointment. -Patient encouraged to call the office with any questions, concerns, change in symptoms.   Procedure: Injection small joint Discussed alternatives, risks, complications and verbal consent was obtained.  Location: LEFT first MPJ Skin Prep: Betadine Injectate: 0.5cc 0.5% marcaine plain, 0.5 cc 2% lidocaine  plain and, 1 cc kenalog 10. Disposition: Patient tolerated procedure well. Injection site dressed with a band-aid.  Post-injection care was discussed and return precautions discussed.   [name]

## 2019-02-13 ENCOUNTER — Ambulatory Visit: Payer: BLUE CROSS/BLUE SHIELD | Admitting: Podiatry

## 2019-02-13 ENCOUNTER — Ambulatory Visit: Payer: BC Managed Care – PPO | Admitting: Podiatry

## 2019-02-24 DIAGNOSIS — F331 Major depressive disorder, recurrent, moderate: Secondary | ICD-10-CM | POA: Diagnosis not present

## 2019-03-02 ENCOUNTER — Ambulatory Visit: Payer: BC Managed Care – PPO | Admitting: Podiatry

## 2019-03-02 ENCOUNTER — Encounter: Payer: Self-pay | Admitting: Podiatry

## 2019-03-02 ENCOUNTER — Ambulatory Visit (INDEPENDENT_AMBULATORY_CARE_PROVIDER_SITE_OTHER): Payer: BC Managed Care – PPO | Admitting: Podiatry

## 2019-03-02 ENCOUNTER — Other Ambulatory Visit: Payer: Self-pay

## 2019-03-02 VITALS — Temp 97.7°F

## 2019-03-02 DIAGNOSIS — M779 Enthesopathy, unspecified: Secondary | ICD-10-CM

## 2019-03-02 DIAGNOSIS — M7752 Other enthesopathy of left foot: Secondary | ICD-10-CM

## 2019-03-02 DIAGNOSIS — L905 Scar conditions and fibrosis of skin: Secondary | ICD-10-CM

## 2019-03-09 ENCOUNTER — Ambulatory Visit: Payer: BC Managed Care – PPO | Admitting: Podiatry

## 2019-03-13 ENCOUNTER — Telehealth: Payer: Self-pay | Admitting: *Deleted

## 2019-03-13 DIAGNOSIS — M779 Enthesopathy, unspecified: Secondary | ICD-10-CM

## 2019-03-13 DIAGNOSIS — L905 Scar conditions and fibrosis of skin: Secondary | ICD-10-CM

## 2019-03-13 NOTE — Progress Notes (Signed)
Subjective: 38 year old female presents the office today for follow-up evaluation left foot pain.  She is the injection did help quite a bit.  She states that she started get some discomfort again but overall doing much better.  She points more to the arch of the foot just below the medial cuneiform she is majority discomfort now.  Also she is asked about the scar injection on the gastrocnemius recession site.  She is also inquiring about possibly a TENS unit.  Denies any systemic complaints such as fevers, chills, nausea, vomiting. No acute changes since last appointment, and no other complaints at this time.   Objective: AAO x3, NAD DP/PT pulses palpable bilaterally, CRT less than 3 seconds There is still mild restriction in dorsiflexion of the first MPJ but overall is no significant tenderness.  No joint tenderness appears to be just inferior to the medial cuneiform.  No pain on tibialis anterior tendon.  Scar contracture present on the gastrocnemius recession site.  No pain in this area. No open lesions or pre-ulcerative lesions.  No pain with calf compression, swelling, warmth, erythema  Assessment: Left foot tendinitis, scar  Plan: -All treatment options discussed with the patient including all alternatives, risks, complications.  -Steroid injection was performed on the medial aspect of foot just inferior to the cuneiform.  There is no alcohol and mixture of 0.5 cc of Approximate 0.5 Cc of Marcaine Plain Was Infiltrated in This Area Complications. -Still injection also performed for the scar.  Nurse cleaned alcohol.  0.5 cc of Kenalog 10, 0.5 cc Marcaine plain was infiltrated subdermally underneath the scar to the left gastrocnemius recession site.  She tolerated well. -Will order TENS unit -Patient encouraged to call the office with any questions, concerns, change in symptoms.   Cassandra Kennedy DPM

## 2019-03-13 NOTE — Telephone Encounter (Signed)
I spoke with [facility] and informed Dr. Jacqualyn Posey wanted a TENS unit for pt. Larene Beach states fax clinicals, demographics and insurance information and she will process for the TENS unit. Faxed requested information to [facility].

## 2019-03-13 NOTE — Telephone Encounter (Signed)
-----   Message from Trula Slade, DPM sent at 03/13/2019  7:40 AM EDT ----- Can you please order a TENS unit for her? Thanks.

## 2019-03-20 NOTE — Telephone Encounter (Signed)
Dr. Jacqualyn Posey states he had told pt that we would order and see if her insurance would cover, and she could personal purchase from [place] type group or medical supply. I informed pt of Dr. Leigh Aurora recommendations.

## 2019-03-20 NOTE — Telephone Encounter (Signed)
Received notification from [place], pt's insurance will not cover the TENS unit.

## 2019-03-23 ENCOUNTER — Other Ambulatory Visit: Payer: Self-pay

## 2019-03-23 ENCOUNTER — Ambulatory Visit (INDEPENDENT_AMBULATORY_CARE_PROVIDER_SITE_OTHER): Payer: BC Managed Care – PPO | Admitting: Podiatry

## 2019-03-23 DIAGNOSIS — M779 Enthesopathy, unspecified: Secondary | ICD-10-CM | POA: Diagnosis not present

## 2019-03-23 DIAGNOSIS — M898X9 Other specified disorders of bone, unspecified site: Secondary | ICD-10-CM | POA: Diagnosis not present

## 2019-03-23 DIAGNOSIS — M205X2 Other deformities of toe(s) (acquired), left foot: Secondary | ICD-10-CM

## 2019-03-23 DIAGNOSIS — L905 Scar conditions and fibrosis of skin: Secondary | ICD-10-CM | POA: Diagnosis not present

## 2019-04-03 NOTE — Progress Notes (Signed)
Subjective: 38 year old female presents the office today for follow-up evaluation left foot pain.  The injection did not help as much as I debrided on the big toe joint.  She still has pain most of the arch area as well as to get some discomfort at the top of her foot on the arch.  She was not able to get that TENS unit from the insurance she is going to purchase one.  No recent injury or changes otherwise. Denies any systemic complaints such as fevers, chills, nausea, vomiting. No acute changes since last appointment, and no other complaints at this time.   Objective: AAO x3, NAD DP/PT pulses palpable bilaterally, CRT less than 3 seconds There is still mild restriction in dorsiflexion of the first MPJ however there is no significant tenderness palpation today.  Majority discomfort mostly to the arch of the foot is in the midfoot area plantarly as well as dorsally.  There is a bony prominence present at the dorsal aspect of the midfoot along the previous surgical site.  She does get tenderness to this area but also she describes tenderness along the medial aspect in general.  No significant pinpoint tenderness.  Also scarring still present but gastrocnemius recession site however there is bruising still present on the prior injection.  No pain with calf compression, swelling, warmth, erythema  Assessment: Left foot tendinitis, scar; exostosis  Plan: -All treatment options discussed with the patient including all alternatives, risks, complications.  -Reviewed the old x-rays with her again.  Do think part of her pain is coming from a small bony exostosis, also the dorsal midfoot.  We discussed surgical excision if needed.  Continue with arch supports we also discussed other shoes that she can wear including sandals with good arch support.  We also went through different TENS units to purchase.  I held off on another injection to the gastrocnemius scar site and there is still bruising present.  Return  in about 6 weeks (around 05/04/2019).  Trula Slade DPM

## 2019-05-05 DIAGNOSIS — Z1389 Encounter for screening for other disorder: Secondary | ICD-10-CM | POA: Diagnosis not present

## 2019-05-05 DIAGNOSIS — F411 Generalized anxiety disorder: Secondary | ICD-10-CM | POA: Diagnosis not present

## 2019-05-07 ENCOUNTER — Ambulatory Visit: Payer: BLUE CROSS/BLUE SHIELD | Admitting: Podiatry

## 2019-05-16 DIAGNOSIS — L700 Acne vulgaris: Secondary | ICD-10-CM | POA: Diagnosis not present

## 2019-05-16 DIAGNOSIS — H019 Unspecified inflammation of eyelid: Secondary | ICD-10-CM | POA: Diagnosis not present

## 2019-06-18 DIAGNOSIS — Z3169 Encounter for other general counseling and advice on procreation: Secondary | ICD-10-CM | POA: Diagnosis not present

## 2019-07-04 DIAGNOSIS — F331 Major depressive disorder, recurrent, moderate: Secondary | ICD-10-CM | POA: Diagnosis not present

## 2019-07-16 DIAGNOSIS — N710 Acute inflammatory disease of uterus: Secondary | ICD-10-CM | POA: Diagnosis not present

## 2019-07-16 DIAGNOSIS — Z3141 Encounter for fertility testing: Secondary | ICD-10-CM | POA: Diagnosis not present

## 2019-07-16 DIAGNOSIS — N856 Intrauterine synechiae: Secondary | ICD-10-CM | POA: Diagnosis not present

## 2019-07-16 DIAGNOSIS — Z3202 Encounter for pregnancy test, result negative: Secondary | ICD-10-CM | POA: Diagnosis not present

## 2019-07-17 DIAGNOSIS — Z118 Encounter for screening for other infectious and parasitic diseases: Secondary | ICD-10-CM | POA: Diagnosis not present

## 2019-07-17 DIAGNOSIS — Z113 Encounter for screening for infections with a predominantly sexual mode of transmission: Secondary | ICD-10-CM | POA: Diagnosis not present

## 2019-07-17 DIAGNOSIS — Z1159 Encounter for screening for other viral diseases: Secondary | ICD-10-CM | POA: Diagnosis not present

## 2019-07-17 DIAGNOSIS — Z369 Encounter for antenatal screening, unspecified: Secondary | ICD-10-CM | POA: Diagnosis not present

## 2019-09-11 ENCOUNTER — Encounter: Payer: Self-pay | Admitting: Podiatry

## 2019-09-11 ENCOUNTER — Other Ambulatory Visit: Payer: Self-pay

## 2019-09-11 ENCOUNTER — Ambulatory Visit (INDEPENDENT_AMBULATORY_CARE_PROVIDER_SITE_OTHER): Payer: BC Managed Care – PPO

## 2019-09-11 ENCOUNTER — Ambulatory Visit (INDEPENDENT_AMBULATORY_CARE_PROVIDER_SITE_OTHER): Payer: BC Managed Care – PPO | Admitting: Podiatry

## 2019-09-11 DIAGNOSIS — M779 Enthesopathy, unspecified: Secondary | ICD-10-CM

## 2019-09-11 DIAGNOSIS — M205X2 Other deformities of toe(s) (acquired), left foot: Secondary | ICD-10-CM

## 2019-09-11 DIAGNOSIS — Z969 Presence of functional implant, unspecified: Secondary | ICD-10-CM | POA: Diagnosis not present

## 2019-09-11 DIAGNOSIS — M898X9 Other specified disorders of bone, unspecified site: Secondary | ICD-10-CM

## 2019-09-11 NOTE — Patient Instructions (Signed)

## 2019-09-18 NOTE — Progress Notes (Signed)
Subjective: 39 year old female presents the office today for follow-up evaluation continued pain and numbness on the top of her left foot and she also gets discomfort in her big toe joint.  She wants to go to proceed with surgery to remove the bone spur on the top of her foot.  Also asking if anything else I can going for the big toe joint at this time. Denies any systemic complaints such as fevers, chills, nausea, vomiting. No acute changes since last appointment, and no other complaints at this time.   Objective: AAO x3, NAD DP/PT pulses palpable bilaterally, CRT less than 3 seconds Dorsal dorsal aspect of the small area of prominent bone spur also there is some discomfort in the course of the tibialis anterior tendon.  There is still mild restriction in dorsiflexion of first MPJ. Edema no erythema to the MPJ but minimal edema on the dorsal bone spur. No pain with calf compression, swelling, warmth, erythema  Assessment: Spur dorsal left foot with hallux limitus  Plan: -All treatment options discussed with the patient including all alternatives, risks, complications.  -X-rays obtained reviewed.  Skin marker was used to identify the area of prominent bone spur.  No evidence of acute fracture.  Hardware intact the first metatarsophalangeal joint as well as the subtalar joint. -We discussed both conservative as well as surgical treatment options.  At this time she was to proceed with surgical intervention.  We discussed exostectomy as well as a PRP injection on the tendon.  Also discussed heart removal from the first metatarsal and cheilectomy.  She wants to proceed with this. -The incision placement as well as the postoperative course was discussed with the patient. I discussed risks of the surgery which include, but not limited to, infection, bleeding, pain, swelling, need for further surgery, delayed or nonhealing, painful or ugly scar, numbness or sensation changes, over/under correction,  recurrence, transfer lesions, further deformity, hardware failure, DVT/PE, loss of toe/foot. Patient understands these risks and wishes to proceed with surgery. The surgical consent was reviewed with the patient all 3 pages were signed. No promises or guarantees were given to the outcome of the procedure. All questions were answered to the best of my ability. Before the surgery the patient was encouraged to call the office if there is any further questions. The surgery will be performed at the [facility] on an outpatient basis.  [name]

## 2019-09-24 ENCOUNTER — Telehealth: Payer: Self-pay | Admitting: Podiatry

## 2019-09-24 NOTE — Telephone Encounter (Signed)
DOS: 10/10/2019  SURGICAL PROCEDURES: Tarsal Exostectomy ULAG(53646), PRP Injection OEHO(1224M), Removal Fixation Deep Kwire/Screw GNOI(37048), and Cheilectomy GQBV(69450).  BCBS Policy Effective : 08/17/2019  -  08/15/9998   Member Liability Summary       In-Network   Max Per Benefit Period  Year-to-Date Remaining     CoInsurance         Deductible $4,000.00  $3,601.28     Out-Of-Pocket 3 $7,000.00 $6,601.28 3 Out-of-Pocket includes copay, deductible, and coinsurance.  [facility]      In-Network Copay Coinsurance  Authorization Required Not Applicable 20%   Yes Utilization Management Organization: UT[employer]IZATION [employer]NAGEMENT Telephone:  [phone]

## 2019-10-05 ENCOUNTER — Telehealth: Payer: Self-pay

## 2019-10-05 NOTE — Telephone Encounter (Addendum)
DOS 10/10/19 CHEILECTOMY LT - 06004 REMOVAL FIXATION DEEP KWIRE/SCREW - 20680 TARSAL EXOSTECTOMY LT - 28104 PRP INJECTION - 0232T  [employer] EFFECTIVE DATE - 08/17/19  PLAN DEDUCTIBLE - $4,000 W/ $3601.28 REMAINING  OUT OF POCKET - $7000 W/ $5997.74 REMAINING  Called [phone] for pre-cert information. Spoke to [employer] [facility] at [employer] - No Pre-cert required Call ref# [id number]

## 2019-10-10 ENCOUNTER — Other Ambulatory Visit: Payer: Self-pay | Admitting: Podiatry

## 2019-10-10 ENCOUNTER — Encounter: Payer: Self-pay | Admitting: Podiatry

## 2019-10-10 DIAGNOSIS — M25572 Pain in left ankle and joints of left foot: Secondary | ICD-10-CM | POA: Diagnosis not present

## 2019-10-10 DIAGNOSIS — Z4889 Encounter for other specified surgical aftercare: Secondary | ICD-10-CM

## 2019-10-10 DIAGNOSIS — Z967 Presence of other bone and tendon implants: Secondary | ICD-10-CM | POA: Diagnosis not present

## 2019-10-10 DIAGNOSIS — M205X2 Other deformities of toe(s) (acquired), left foot: Secondary | ICD-10-CM | POA: Diagnosis not present

## 2019-10-10 DIAGNOSIS — M898X7 Other specified disorders of bone, ankle and foot: Secondary | ICD-10-CM | POA: Diagnosis not present

## 2019-10-10 DIAGNOSIS — M7672 Peroneal tendinitis, left leg: Secondary | ICD-10-CM | POA: Diagnosis not present

## 2019-10-10 DIAGNOSIS — E039 Hypothyroidism, unspecified: Secondary | ICD-10-CM | POA: Diagnosis not present

## 2019-10-10 DIAGNOSIS — M25775 Osteophyte, left foot: Secondary | ICD-10-CM | POA: Diagnosis not present

## 2019-10-10 DIAGNOSIS — M2022 Hallux rigidus, left foot: Secondary | ICD-10-CM

## 2019-10-10 MED ORDER — CEPHALEXIN 500 MG PO CAPS: 500.0000 mg | ORAL_CAPSULE | Freq: Three times a day (TID) | ORAL | 0 refills | Status: DC

## 2019-10-10 MED ORDER — PROMETHAZINE HCL 25 MG PO TABS: 25.0000 mg | ORAL_TABLET | Freq: Three times a day (TID) | ORAL | 0 refills | Status: DC | PRN

## 2019-10-10 MED ORDER — OXYCODONE-ACETAMINOPHEN 5-325 MG PO TABS: 1.0000 | ORAL_TABLET | Freq: Four times a day (QID) | ORAL | 0 refills | Status: DC | PRN

## 2019-10-10 NOTE — Progress Notes (Signed)
Post-op medications sent to the pharmacy.  

## 2019-10-11 ENCOUNTER — Telehealth: Payer: Self-pay | Admitting: *Deleted

## 2019-10-11 NOTE — Telephone Encounter (Signed)
Called and left a message for the patient to call me back-I was calling to see how the patient was doing after surgery with Dr Ardelle Anton on Wednesday October 10, 2019. Misty Stanley

## 2019-10-15 ENCOUNTER — Ambulatory Visit (INDEPENDENT_AMBULATORY_CARE_PROVIDER_SITE_OTHER): Payer: BC Managed Care – PPO

## 2019-10-15 ENCOUNTER — Ambulatory Visit (INDEPENDENT_AMBULATORY_CARE_PROVIDER_SITE_OTHER): Payer: BC Managed Care – PPO | Admitting: Podiatry

## 2019-10-15 ENCOUNTER — Other Ambulatory Visit: Payer: Self-pay

## 2019-10-15 DIAGNOSIS — M205X2 Other deformities of toe(s) (acquired), left foot: Secondary | ICD-10-CM

## 2019-10-15 DIAGNOSIS — M779 Enthesopathy, unspecified: Secondary | ICD-10-CM | POA: Diagnosis not present

## 2019-10-15 MED ORDER — OXYCODONE-ACETAMINOPHEN 5-325 MG PO TABS: 1.0000 | ORAL_TABLET | Freq: Four times a day (QID) | ORAL | 0 refills | Status: DC | PRN

## 2019-10-16 ENCOUNTER — Other Ambulatory Visit: Payer: Self-pay | Admitting: Podiatry

## 2019-10-16 MED ORDER — OXYCODONE-ACETAMINOPHEN 5-325 MG PO TABS: 1.0000 | ORAL_TABLET | Freq: Four times a day (QID) | ORAL | 0 refills | Status: AC | PRN

## 2019-10-16 NOTE — Progress Notes (Signed)
Pain medication was resent given I was not able to send it yesterday as the system was down.

## 2019-10-25 ENCOUNTER — Other Ambulatory Visit: Payer: Self-pay

## 2019-10-25 ENCOUNTER — Ambulatory Visit (INDEPENDENT_AMBULATORY_CARE_PROVIDER_SITE_OTHER): Payer: BC Managed Care – PPO | Admitting: Podiatry

## 2019-10-25 DIAGNOSIS — M898X9 Other specified disorders of bone, unspecified site: Secondary | ICD-10-CM

## 2019-10-25 DIAGNOSIS — M205X2 Other deformities of toe(s) (acquired), left foot: Secondary | ICD-10-CM

## 2019-10-25 DIAGNOSIS — M779 Enthesopathy, unspecified: Secondary | ICD-10-CM

## 2019-10-25 NOTE — Progress Notes (Signed)
Subjective: Cassandra Kennedy is a 39 y.o. is seen today in office s/p left foot exostectomy, cheilectomy with hardware removal and PRP injection preformed on 09/20/2019.  She states that her pain did improve.  She gets some minor swelling she states that she has an occasional nerve pain but otherwise has been doing well.  She has been in the cam boot.. Denies any systemic complaints such as fevers, chills, nausea, vomiting. No calf pain, chest pain, shortness of breath.   Objective: General: No acute distress, AAOx3  DP/PT pulses palpable 2/4, CRT < 3 sec to all digits.  Protective sensation intact. Motor function intact.  LEFT surgery: Incision is well coapted without any evidence of dehiscence with sutures intact. There is no surrounding erythema, ascending cellulitis, fluctuance, crepitus, malodor, drainage/purulence. There is mild edema around the surgical site. There is mild pain along the surgical site.  Improved range of motion of the first MPJ No other areas of tenderness to bilateral lower extremities.  No other open lesions or pre-ulcerative lesions.  No pain with calf compression, swelling, warmth, erythema.   Assessment and Plan:  Status post left foot surgery, doing well with no complications   -Treatment options discussed including all alternatives, risks, and complications -X-rays obtained and reviewed.  Hardware removal from the first metatarsal.  Evidence of exostectomy midfoot as well as first metatarsal phalangeal joint dorsally.  No evidence of acute fracture. -Antibiotic ointment and a dressing applied.  Keep the dressing clean, dry, intact. -Remain in cam boot -Ice/elevation -Pain medication as needed. -Monitor for any clinical signs or symptoms of infection and DVT/PE and directed to call the office immediately should any occur or go to the ER. -Follow-up as scheduled for possible suture removal or sooner if any problems arise. In the meantime, encouraged to call the office  with any questions, concerns, change in symptoms.   Ovid Curd, DPM

## 2019-11-01 NOTE — Progress Notes (Signed)
Subjective: Cassandra Kennedy is a 39 y.o. is seen today in office s/p left foot dorsal foot exostectomy, cheilectomy part removal and PRP injection preformed on 09/20/2019.  Overall he states that she is feeling better.  The pain she was having initially after surgery has much improved.  She is walking in a cam boot without any recent injury. Denies any systemic complaints such as fevers, chills, nausea, vomiting. No calf pain, chest pain, shortness of breath.   Objective: General: No acute distress, AAOx3  DP/PT pulses palpable 2/4, CRT < 3 sec to all digits.  Protective sensation intact. Motor function intact.  LEFT foot: Incision is well coapted without any evidence of dehiscence with sutures intact. There is no surrounding erythema, ascending cellulitis, fluctuance, crepitus, malodor, drainage/purulence. There is mild edema around the surgical site. There is mild pain along the surgical site.  Improved range of motion of first MPJ. No other areas of tenderness to bilateral lower extremities.  No other open lesions or pre-ulcerative lesions.  No pain with calf compression, swelling, warmth, erythema.   Assessment and Plan:  Status post left foot surgery, doing well with no complications   -Treatment options discussed including all alternatives, risks, and complications -X-rays obtained reviewed.  Status post removal of hardware from the first metatarsal, bone spur excision.  No evidence of complicating factors. -Antibiotic ointment and dressing applied.  Keep the dressing clean, dry, intact. -We discussed gentle range of motion exercises -Ice/elevation -Pain medication as needed. -Monitor for any clinical signs or symptoms of infection and DVT/PE and directed to call the office immediately should any occur or go to the ER. -Follow-up as scheduled or sooner if any problems arise. In the meantime, encouraged to call the office with any questions, concerns, change in symptoms.   Ovid Curd,  DPM

## 2019-11-08 ENCOUNTER — Encounter: Payer: BC Managed Care – PPO | Admitting: Podiatry

## 2019-11-15 ENCOUNTER — Other Ambulatory Visit: Payer: Self-pay

## 2019-11-15 ENCOUNTER — Ambulatory Visit (INDEPENDENT_AMBULATORY_CARE_PROVIDER_SITE_OTHER): Payer: BC Managed Care – PPO | Admitting: Podiatry

## 2019-11-15 DIAGNOSIS — M205X2 Other deformities of toe(s) (acquired), left foot: Secondary | ICD-10-CM

## 2019-11-15 DIAGNOSIS — Z969 Presence of functional implant, unspecified: Secondary | ICD-10-CM

## 2019-11-21 NOTE — Progress Notes (Signed)
Subjective: Cassandra Kennedy is a 39 y.o. is seen today in office s/p left foot dorsal foot exostectomy, cheilectomy part removal and PRP injection preformed on 09/20/2019.  Doing well.  She is having no significant pain just some mild soreness at times.  She has noticed them much improved range of motion of the first MPJ.  She still in the surgical boot.  No fevers, chills, nausea, vomiting.  No calf pain, chest pain, shortness of breath.    Objective: General: No acute distress, AAOx3  DP/PT pulses palpable 2/4, CRT < 3 sec to all digits.  Protective sensation intact. Motor function intact.  LEFT foot: Incision is well coapted without any evidence of dehiscence with suture ends intact.  There is no surrounding erythema, drainage or pus.  Trace edema present.  There is no pain with MPJ range of motion.  Much improved compared to prior to surgery.  There is no area of discomfort identified today. No other open lesions or pre-ulcerative lesions.  No pain with calf compression, swelling, warmth, erythema.   Assessment and Plan:  Status post left foot surgery, doing well with no complications   -Treatment options discussed including all alternatives, risks, and complications -Overall she is doing much better.  She is on the suture instrument on their own.  She states they have started to come out.  Continue range of motion exercises at home rehab.  She has been to physical therapy before and she knows what to do at home.  Gradual transition back into regular shoe.  Continue ice elevate.   [name]

## 2019-12-17 ENCOUNTER — Encounter: Payer: BC Managed Care – PPO | Admitting: Podiatry

## 2020-01-09 DIAGNOSIS — Z01419 Encounter for gynecological examination (general) (routine) without abnormal findings: Secondary | ICD-10-CM | POA: Diagnosis not present

## 2020-01-09 DIAGNOSIS — Z6826 Body mass index (BMI) 26.0-26.9, adult: Secondary | ICD-10-CM | POA: Diagnosis not present

## 2020-01-11 ENCOUNTER — Other Ambulatory Visit: Payer: Self-pay | Admitting: Obstetrics and Gynecology

## 2020-01-11 DIAGNOSIS — N6489 Other specified disorders of breast: Secondary | ICD-10-CM

## 2020-01-23 ENCOUNTER — Other Ambulatory Visit: Payer: Self-pay

## 2020-01-23 ENCOUNTER — Ambulatory Visit
Admission: RE | Admit: 2020-01-23 | Discharge: 2020-01-23 | Disposition: A | Discharge status: Home / Self Care | Payer: BLUE CROSS/BLUE SHIELD | Source: Ambulatory Visit | Attending: Obstetrics and Gynecology | Admitting: Obstetrics and Gynecology

## 2020-01-23 ENCOUNTER — Ambulatory Visit
Admission: RE | Admit: 2020-01-23 | Discharge: 2020-01-23 | Disposition: A | Discharge status: Home / Self Care | Payer: BC Managed Care – PPO | Source: Ambulatory Visit | Attending: Obstetrics and Gynecology | Admitting: Obstetrics and Gynecology

## 2020-01-23 DIAGNOSIS — N6489 Other specified disorders of breast: Secondary | ICD-10-CM

## 2020-01-23 DIAGNOSIS — R928 Other abnormal and inconclusive findings on diagnostic imaging of breast: Secondary | ICD-10-CM | POA: Diagnosis not present

## 2020-03-20 ENCOUNTER — Other Ambulatory Visit: Payer: Self-pay

## 2020-03-21 ENCOUNTER — Encounter: Payer: Self-pay | Admitting: Family Medicine

## 2020-03-21 ENCOUNTER — Ambulatory Visit (INDEPENDENT_AMBULATORY_CARE_PROVIDER_SITE_OTHER): Payer: BC Managed Care – PPO | Admitting: Family Medicine

## 2020-03-21 VITALS — BP 90/68 | HR 77 | Temp 98.1°F | Ht 61.0 in | Wt 139.2 lb

## 2020-03-21 DIAGNOSIS — M79641 Pain in right hand: Secondary | ICD-10-CM

## 2020-03-21 DIAGNOSIS — M79642 Pain in left hand: Secondary | ICD-10-CM

## 2020-03-21 DIAGNOSIS — Z7689 Persons encountering health services in other specified circumstances: Secondary | ICD-10-CM

## 2020-03-21 DIAGNOSIS — R768 Other specified abnormal immunological findings in serum: Secondary | ICD-10-CM

## 2020-03-21 DIAGNOSIS — E039 Hypothyroidism, unspecified: Secondary | ICD-10-CM | POA: Diagnosis not present

## 2020-03-21 NOTE — Patient Instructions (Signed)
Health Maintenance Due  °Topic Date Due  °• Hepatitis C Screening  Never done  °• COVID-19 Vaccine (1) Never done  °• HIV Screening  Never done  °• PAP SMEAR-Modifier  Never done  °• INFLUENZA VACCINE  03/16/2020  ° ° °No flowsheet data found. °

## 2020-03-21 NOTE — Progress Notes (Signed)
Cassandra Kennedy is a 39 y.o. female  Chief Complaint  Patient presents with  . Establish Care    New patient c/o hand pain, both hands x 8 months.  pt said that she has a hard time opening things, achy pain.  Pt has tried Tylenol and Ibuprofen.    HPI: Cassandra Kennedy is a 39 y.o. female here to establish care with our office.  She complains of B/L hand pain x 8 mo. Describes as achy, sore. Lt > Rt. Located mainly in MCP and PIP joints. Few instances of swelling. Generally improves/resolves as day goes on. She notes having trouble opening jars, ? decreased strength. No numbness or tingling. She takes ibuprofen with relief.  Pt is Rt handed No fam h/o autoimmune conditions. Mom with fibromyalgia. She has never seen rheum.  Specialists: podiatry (Dr. Ardelle Anton), GYN (Dr. Rosemary Holms), [facility]   Last CPE, labs: labs done in 10/2019 with [facility]   Last PAP: UTD 01/2020  Med refills needed today: none   Past Medical History:  Diagnosis Date  . Anxiety   . Depression   . Heart palpitations   . Hypothyroidism     Past Surgical History:  Procedure Laterality Date  . BUNIONECTOMY    . FOOT SURGERY Left 06/2017   F/U surgery from 08/2017 foot surgery  . In vitro     X 3 , 2013, 2015, 2017  . Laproscopy of uterus    . TRIPLE SUBTALAR FUSION Left 09/15/2016   Procedure: SUBTALAR FUSION, COTTON OSTEOTOMY, GASTROC NEMIUS RECESS;  Surgeon: Vivi Barrack, DPM;  Location: [facility] OR;  Service: Podiatry;  Laterality: Left;    Social History   Socioeconomic History  . Marital status: Married    Spouse name: JP  . Number of children: Not on file  . Years of education: Not on file  . Highest education level: Not on file  Occupational History  . Occupation: stay at home mom  Tobacco Use  . Smoking status: Never Smoker  . Smokeless tobacco: Never Used  Substance and Sexual Activity  . Alcohol use: Not Currently    Comment: Ocassionally  . Drug use: No    . Sexual activity: Yes  Other Topics Concern  . Not on file  Social History Narrative  . Not on file   Social Determinants of Health   Financial Resource Strain:   . Difficulty of Paying Living Expenses:   Food Insecurity:   . Worried About Programme researcher, broadcasting/film/video in the Last Year:   . Barista in the Last Year:   Transportation Needs:   . Freight forwarder (Medical):   Marland Kitchen Lack of Transportation (Non-Medical):   Physical Activity:   . Days of Exercise per Week:   . Minutes of Exercise per Session:   Stress:   . Feeling of Stress :   Social Connections:   . Frequency of Communication with Friends and Family:   . Frequency of Social Gatherings with Friends and Family:   . Attends Religious Services:   . Active Member of Clubs or Organizations:   . Attends Banker Meetings:   Marland Kitchen Marital Status:   Intimate Partner Violence:   . Fear of Current or Ex-Partner:   . Emotionally Abused:   Marland Kitchen Physically Abused:   . Sexually Abused:     Family History  Problem Relation Age of Onset  . Breast cancer Neg Hx      Immunization History  Administered Date(s) Administered  . Influenza,inj,Quad PF,6+ Mos 06/06/2018  . Tdap 06/21/2015    Outpatient Encounter Medications as of 03/21/2020  Medication Sig  . ibuprofen (ADVIL,MOTRIN) 600 MG tablet Take 1 tablet (600 mg total) by mouth every 6 (six) hours.  Marland Kitchen levothyroxine (SYNTHROID, LEVOTHROID) 100 MCG tablet Take 1 tablet (100 mcg total) by mouth daily before breakfast.  . oxyCODONE-acetaminophen (PERCOCET/ROXICET) 5-325 MG tablet Take 1-2 tablets by mouth every 6 (six) hours as needed for severe pain.  Marland Kitchen sertraline (ZOLOFT) 25 MG tablet Take 25 mg by mouth daily.  . [DISCONTINUED] cephALEXin (KEFLEX) 500 MG capsule Take 1 capsule (500 mg total) by mouth 3 (three) times daily.  . [DISCONTINUED] promethazine (PHENERGAN) 25 MG tablet Take 1 tablet (25 mg total) by mouth every 8 (eight) hours as needed for nausea or  vomiting. (Patient not taking: Reported on 03/21/2020)  . [DISCONTINUED] zolpidem (AMBIEN) 5 MG tablet Take 5 mg by mouth daily.   No facility-administered encounter medications on file as of 03/21/2020.     ROS: Pertinent positives and negatives noted in HPI. Remainder of ROS non-contributory    Allergies  Allergen Reactions  . Metronidazole Other (See Comments)    Neck felt hot, and got a little feverish after taking it for about 4 days or so.  . Nickel Rash    BP 90/68 (BP Location: Left Arm, Patient Position: Sitting, Cuff Size: Normal)   Pulse 77   Temp 98.1 F (36.7 C) (Temporal)   Ht 5\' 1"  (1.549 m)   Wt 139 lb 3.2 oz (63.1 kg)   LMP 03/20/2020   SpO2 98%   BMI 26.30 kg/m   Physical Exam Constitutional:      Appearance: Normal appearance. She is not ill-appearing or toxic-appearing.  Musculoskeletal:     Right hand: No swelling, deformity, tenderness or bony tenderness. Normal range of motion. Normal strength. Normal sensation. Normal pulse.     Left hand: No swelling, deformity, tenderness or bony tenderness. Normal range of motion. Normal strength. Normal sensation. Normal pulse.  Skin:    General: Skin is warm and dry.  Neurological:     General: No focal deficit present.     Mental Status: She is alert and oriented to person, place, and time.     Motor: No weakness.     Gait: Gait normal.  Psychiatric:        Mood and Affect: Mood normal.        Behavior: Behavior normal.      A/P:  1. Encounter to establish care with new doctor  2. Hypothyroidism, unspecified type - currently on levothyroxine 05/20/2020 daily - pt seen for consultation at [facility] and recommended to switch to armour thyroid. Pt would like to try this. Will get updated TFTs and then f/u via MyChart with pt - TSH - T4, free - T3  3. Bilateral hand pain - worse in AM, daily x 8 mo; rare minimal swelling, no erythema - no fam h/o RA or other autoimmune conditions -  pain improves with NSAID - C-reactive protein - Rheumatoid factor - ANA - Sedimentation rate - Celiac Disease Panel  I personally spent 35 min with the patient today obtaining HPI, medical history and previous work-up/eval, and discussing possible etiology of symptoms, hypothyroid management, etc.   This visit occurred during the SARS-CoV-2 public health emergency.  Safety protocols were in place, including screening questions prior to the visit, additional usage of staff PPE, and extensive cleaning of  exam room while observing appropriate contact time as indicated for disinfecting solutions.

## 2020-03-27 ENCOUNTER — Other Ambulatory Visit: Payer: Self-pay

## 2020-03-28 ENCOUNTER — Other Ambulatory Visit: Payer: BC Managed Care – PPO

## 2020-04-11 ENCOUNTER — Other Ambulatory Visit: Payer: Self-pay

## 2020-04-11 ENCOUNTER — Other Ambulatory Visit (INDEPENDENT_AMBULATORY_CARE_PROVIDER_SITE_OTHER): Payer: BC Managed Care – PPO

## 2020-04-11 DIAGNOSIS — M79641 Pain in right hand: Secondary | ICD-10-CM

## 2020-04-11 DIAGNOSIS — E039 Hypothyroidism, unspecified: Secondary | ICD-10-CM | POA: Diagnosis not present

## 2020-04-11 DIAGNOSIS — Z7689 Persons encountering health services in other specified circumstances: Secondary | ICD-10-CM | POA: Diagnosis not present

## 2020-04-11 DIAGNOSIS — M79642 Pain in left hand: Secondary | ICD-10-CM | POA: Diagnosis not present

## 2020-04-11 LAB — TSH: TSH: 1.24 u[IU]/mL (ref 0.35–4.50)

## 2020-04-11 LAB — T4, FREE: Free T4: 1.19 ng/dL (ref 0.60–1.60)

## 2020-04-11 LAB — SEDIMENTATION RATE: Sed Rate: 1 mm/hr (ref 0–20)

## 2020-04-11 LAB — C-REACTIVE PROTEIN: CRP: <1 mg/dL (ref 0.5–20.0)

## 2020-04-15 LAB — CELIAC DISEASE PANEL
(tTG) Ab, IgA: 1 U/mL
(tTG) Ab, IgG: 1 U/mL
Gliadin IgA: 2 Units
Gliadin IgG: 1 Units
Immunoglobulin A: 109 mg/dL (ref 47–310)

## 2020-04-15 LAB — ANA: Anti Nuclear Antibody (ANA): POSITIVE — AB

## 2020-04-15 LAB — ANTI-NUCLEAR AB-TITER (ANA TITER): ANA Titer 1: 1:80 {titer} — ABNORMAL HIGH

## 2020-04-15 LAB — T3: T3, Total: 99 ng/dL (ref 76–181)

## 2020-04-15 LAB — RHEUMATOID FACTOR: Rhuematoid fact SerPl-aCnc: <14 IU/mL (ref <14)

## 2020-04-29 ENCOUNTER — Telehealth: Payer: Self-pay | Admitting: Family Medicine

## 2020-04-29 DIAGNOSIS — R768 Other specified abnormal immunological findings in serum: Secondary | ICD-10-CM

## 2020-04-29 DIAGNOSIS — M79641 Pain in right hand: Secondary | ICD-10-CM

## 2020-04-29 DIAGNOSIS — M79642 Pain in left hand: Secondary | ICD-10-CM

## 2020-04-29 NOTE — Telephone Encounter (Signed)
Patient is calling to see if she can be referred to a different Rheumatologist. She said that the office she was referred to can't see her until January. Please give her a call back at [phone] if you have any questions.

## 2020-04-29 NOTE — Telephone Encounter (Signed)
Cassandra Kennedy, can you change pts rheum referral to a different provider or group please? Thanks

## 2020-04-29 NOTE — Telephone Encounter (Signed)
Dr. [name] pt is calling to see if she can be referred to a different Rheumatologist. She states that one can't see her till Jan. 2022. Is this a Cassandra Kennedy question? I can forward this to her if need to.

## 2020-04-29 NOTE — Telephone Encounter (Signed)
I meant to send this to [facility], will forward this now.  Thank you

## 2020-04-30 NOTE — Telephone Encounter (Signed)
Pt had already scheduled this appt, I can send to a different location but I will just need you to put in a new referral so I can do so

## 2020-04-30 NOTE — Telephone Encounter (Signed)
Referral sent 

## 2020-04-30 NOTE — Telephone Encounter (Signed)
Referral placed to [facility] Rheum

## 2020-04-30 NOTE — Telephone Encounter (Signed)
Both of those appts have been cancelled so is a new referral still needed?

## 2020-04-30 NOTE — Telephone Encounter (Signed)
It is

## 2020-05-02 NOTE — Telephone Encounter (Signed)
Pt is aware and has an appointment in October to Rheumatology.

## 2020-05-23 ENCOUNTER — Other Ambulatory Visit: Payer: Self-pay

## 2020-05-23 ENCOUNTER — Telehealth: Payer: Self-pay | Admitting: Family Medicine

## 2020-05-23 MED ORDER — LEVOTHYROXINE SODIUM 100 MCG PO TABS: 100.0000 ug | ORAL_TABLET | Freq: Every day | ORAL | 3 refills | Status: DC

## 2020-05-23 NOTE — Telephone Encounter (Signed)
Patient is calling and requesting a refill for Synthroid [facility] to [facility] on [address], please advise. CB is [phone].

## 2020-05-23 NOTE — Telephone Encounter (Signed)
Spoke to patient and that we sent the RX to the pharmacy as requested.  Dm/cma

## 2020-06-06 ENCOUNTER — Telehealth: Payer: Self-pay | Admitting: Family Medicine

## 2020-06-06 DIAGNOSIS — F419 Anxiety disorder, unspecified: Secondary | ICD-10-CM

## 2020-06-06 MED ORDER — SERTRALINE HCL 25 MG PO TABS: 25.0000 mg | ORAL_TABLET | Freq: Every day | ORAL | 3 refills | Status: AC

## 2020-06-06 NOTE — Telephone Encounter (Signed)
Last OV 03/21/20 Last fill 03/13/20  Historical Provider Please see message and advise.  Thank you.

## 2020-06-06 NOTE — Telephone Encounter (Signed)
Rx refilled as requested.  

## 2020-06-06 NOTE — Telephone Encounter (Signed)
Patient is calling to get a refill on her Sertraline 25mg . If approved, [facility] and call her at [phone] to let her know that it has been sent in.

## 2020-06-12 DIAGNOSIS — M9902 Segmental and somatic dysfunction of thoracic region: Secondary | ICD-10-CM | POA: Diagnosis not present

## 2020-06-12 DIAGNOSIS — R768 Other specified abnormal immunological findings in serum: Secondary | ICD-10-CM | POA: Diagnosis not present

## 2020-06-12 DIAGNOSIS — M9901 Segmental and somatic dysfunction of cervical region: Secondary | ICD-10-CM | POA: Diagnosis not present

## 2020-06-12 DIAGNOSIS — M79642 Pain in left hand: Secondary | ICD-10-CM | POA: Diagnosis not present

## 2020-06-12 DIAGNOSIS — M531 Cervicobrachial syndrome: Secondary | ICD-10-CM | POA: Diagnosis not present

## 2020-06-12 DIAGNOSIS — M542 Cervicalgia: Secondary | ICD-10-CM | POA: Diagnosis not present

## 2020-06-12 DIAGNOSIS — M79641 Pain in right hand: Secondary | ICD-10-CM | POA: Diagnosis not present

## 2020-06-16 DIAGNOSIS — M9902 Segmental and somatic dysfunction of thoracic region: Secondary | ICD-10-CM | POA: Diagnosis not present

## 2020-06-16 DIAGNOSIS — M9901 Segmental and somatic dysfunction of cervical region: Secondary | ICD-10-CM | POA: Diagnosis not present

## 2020-06-16 DIAGNOSIS — M542 Cervicalgia: Secondary | ICD-10-CM | POA: Diagnosis not present

## 2020-06-16 DIAGNOSIS — M531 Cervicobrachial syndrome: Secondary | ICD-10-CM | POA: Diagnosis not present

## 2020-06-18 DIAGNOSIS — M531 Cervicobrachial syndrome: Secondary | ICD-10-CM | POA: Diagnosis not present

## 2020-06-18 DIAGNOSIS — M9902 Segmental and somatic dysfunction of thoracic region: Secondary | ICD-10-CM | POA: Diagnosis not present

## 2020-06-18 DIAGNOSIS — M9901 Segmental and somatic dysfunction of cervical region: Secondary | ICD-10-CM | POA: Diagnosis not present

## 2020-06-18 DIAGNOSIS — M542 Cervicalgia: Secondary | ICD-10-CM | POA: Diagnosis not present

## 2020-06-19 DIAGNOSIS — M542 Cervicalgia: Secondary | ICD-10-CM | POA: Diagnosis not present

## 2020-06-19 DIAGNOSIS — M9902 Segmental and somatic dysfunction of thoracic region: Secondary | ICD-10-CM | POA: Diagnosis not present

## 2020-06-19 DIAGNOSIS — M531 Cervicobrachial syndrome: Secondary | ICD-10-CM | POA: Diagnosis not present

## 2020-06-19 DIAGNOSIS — M9901 Segmental and somatic dysfunction of cervical region: Secondary | ICD-10-CM | POA: Diagnosis not present

## 2020-06-24 DIAGNOSIS — M542 Cervicalgia: Secondary | ICD-10-CM | POA: Diagnosis not present

## 2020-06-24 DIAGNOSIS — M9901 Segmental and somatic dysfunction of cervical region: Secondary | ICD-10-CM | POA: Diagnosis not present

## 2020-06-24 DIAGNOSIS — M531 Cervicobrachial syndrome: Secondary | ICD-10-CM | POA: Diagnosis not present

## 2020-06-24 DIAGNOSIS — M9902 Segmental and somatic dysfunction of thoracic region: Secondary | ICD-10-CM | POA: Diagnosis not present

## 2020-06-25 DIAGNOSIS — M9901 Segmental and somatic dysfunction of cervical region: Secondary | ICD-10-CM | POA: Diagnosis not present

## 2020-06-25 DIAGNOSIS — M542 Cervicalgia: Secondary | ICD-10-CM | POA: Diagnosis not present

## 2020-06-25 DIAGNOSIS — M9902 Segmental and somatic dysfunction of thoracic region: Secondary | ICD-10-CM | POA: Diagnosis not present

## 2020-06-25 DIAGNOSIS — M531 Cervicobrachial syndrome: Secondary | ICD-10-CM | POA: Diagnosis not present

## 2020-06-30 DIAGNOSIS — R5382 Chronic fatigue, unspecified: Secondary | ICD-10-CM | POA: Diagnosis not present

## 2020-06-30 DIAGNOSIS — M79642 Pain in left hand: Secondary | ICD-10-CM | POA: Diagnosis not present

## 2020-06-30 DIAGNOSIS — R768 Other specified abnormal immunological findings in serum: Secondary | ICD-10-CM | POA: Diagnosis not present

## 2020-06-30 DIAGNOSIS — M79641 Pain in right hand: Secondary | ICD-10-CM | POA: Diagnosis not present

## 2020-06-30 DIAGNOSIS — M1990 Unspecified osteoarthritis, unspecified site: Secondary | ICD-10-CM | POA: Diagnosis not present

## 2020-07-01 DIAGNOSIS — M9901 Segmental and somatic dysfunction of cervical region: Secondary | ICD-10-CM | POA: Diagnosis not present

## 2020-07-01 DIAGNOSIS — M9902 Segmental and somatic dysfunction of thoracic region: Secondary | ICD-10-CM | POA: Diagnosis not present

## 2020-07-01 DIAGNOSIS — M531 Cervicobrachial syndrome: Secondary | ICD-10-CM | POA: Diagnosis not present

## 2020-07-01 DIAGNOSIS — M542 Cervicalgia: Secondary | ICD-10-CM | POA: Diagnosis not present

## 2020-07-02 DIAGNOSIS — M9902 Segmental and somatic dysfunction of thoracic region: Secondary | ICD-10-CM | POA: Diagnosis not present

## 2020-07-02 DIAGNOSIS — M531 Cervicobrachial syndrome: Secondary | ICD-10-CM | POA: Diagnosis not present

## 2020-07-02 DIAGNOSIS — M542 Cervicalgia: Secondary | ICD-10-CM | POA: Diagnosis not present

## 2020-07-02 DIAGNOSIS — M9901 Segmental and somatic dysfunction of cervical region: Secondary | ICD-10-CM | POA: Diagnosis not present

## 2020-07-08 DIAGNOSIS — M542 Cervicalgia: Secondary | ICD-10-CM | POA: Diagnosis not present

## 2020-07-08 DIAGNOSIS — M531 Cervicobrachial syndrome: Secondary | ICD-10-CM | POA: Diagnosis not present

## 2020-07-08 DIAGNOSIS — M9902 Segmental and somatic dysfunction of thoracic region: Secondary | ICD-10-CM | POA: Diagnosis not present

## 2020-07-08 DIAGNOSIS — M9901 Segmental and somatic dysfunction of cervical region: Secondary | ICD-10-CM | POA: Diagnosis not present

## 2020-07-09 DIAGNOSIS — M9902 Segmental and somatic dysfunction of thoracic region: Secondary | ICD-10-CM | POA: Diagnosis not present

## 2020-07-09 DIAGNOSIS — M531 Cervicobrachial syndrome: Secondary | ICD-10-CM | POA: Diagnosis not present

## 2020-07-09 DIAGNOSIS — M542 Cervicalgia: Secondary | ICD-10-CM | POA: Diagnosis not present

## 2020-07-09 DIAGNOSIS — M9901 Segmental and somatic dysfunction of cervical region: Secondary | ICD-10-CM | POA: Diagnosis not present

## 2020-07-16 DIAGNOSIS — M9901 Segmental and somatic dysfunction of cervical region: Secondary | ICD-10-CM | POA: Diagnosis not present

## 2020-07-16 DIAGNOSIS — M542 Cervicalgia: Secondary | ICD-10-CM | POA: Diagnosis not present

## 2020-07-16 DIAGNOSIS — M531 Cervicobrachial syndrome: Secondary | ICD-10-CM | POA: Diagnosis not present

## 2020-07-16 DIAGNOSIS — M9902 Segmental and somatic dysfunction of thoracic region: Secondary | ICD-10-CM | POA: Diagnosis not present

## 2020-07-17 DIAGNOSIS — M542 Cervicalgia: Secondary | ICD-10-CM | POA: Diagnosis not present

## 2020-07-17 DIAGNOSIS — M531 Cervicobrachial syndrome: Secondary | ICD-10-CM | POA: Diagnosis not present

## 2020-07-17 DIAGNOSIS — M9901 Segmental and somatic dysfunction of cervical region: Secondary | ICD-10-CM | POA: Diagnosis not present

## 2020-07-17 DIAGNOSIS — M9902 Segmental and somatic dysfunction of thoracic region: Secondary | ICD-10-CM | POA: Diagnosis not present

## 2020-07-22 DIAGNOSIS — M542 Cervicalgia: Secondary | ICD-10-CM | POA: Diagnosis not present

## 2020-07-22 DIAGNOSIS — M9902 Segmental and somatic dysfunction of thoracic region: Secondary | ICD-10-CM | POA: Diagnosis not present

## 2020-07-22 DIAGNOSIS — M531 Cervicobrachial syndrome: Secondary | ICD-10-CM | POA: Diagnosis not present

## 2020-07-22 DIAGNOSIS — M9901 Segmental and somatic dysfunction of cervical region: Secondary | ICD-10-CM | POA: Diagnosis not present

## 2020-08-05 DIAGNOSIS — M9901 Segmental and somatic dysfunction of cervical region: Secondary | ICD-10-CM | POA: Diagnosis not present

## 2020-08-05 DIAGNOSIS — M542 Cervicalgia: Secondary | ICD-10-CM | POA: Diagnosis not present

## 2020-08-05 DIAGNOSIS — M531 Cervicobrachial syndrome: Secondary | ICD-10-CM | POA: Diagnosis not present

## 2020-08-05 DIAGNOSIS — M9902 Segmental and somatic dysfunction of thoracic region: Secondary | ICD-10-CM | POA: Diagnosis not present

## 2020-08-07 DIAGNOSIS — M542 Cervicalgia: Secondary | ICD-10-CM | POA: Diagnosis not present

## 2020-08-07 DIAGNOSIS — M9902 Segmental and somatic dysfunction of thoracic region: Secondary | ICD-10-CM | POA: Diagnosis not present

## 2020-08-07 DIAGNOSIS — M531 Cervicobrachial syndrome: Secondary | ICD-10-CM | POA: Diagnosis not present

## 2020-08-07 DIAGNOSIS — M9901 Segmental and somatic dysfunction of cervical region: Secondary | ICD-10-CM | POA: Diagnosis not present

## 2020-08-19 ENCOUNTER — Other Ambulatory Visit: Payer: Self-pay | Admitting: Family Medicine

## 2020-08-19 NOTE — Telephone Encounter (Signed)
Last OV 03/21/20 Last fill 05/23/20  [facility]

## 2020-08-25 ENCOUNTER — Ambulatory Visit: Payer: BC Managed Care – PPO | Admitting: Rheumatology

## 2020-09-15 ENCOUNTER — Ambulatory Visit: Payer: BC Managed Care – PPO | Admitting: Rheumatology

## 2020-09-23 DIAGNOSIS — E288 Other ovarian dysfunction: Secondary | ICD-10-CM | POA: Diagnosis not present

## 2020-10-15 DIAGNOSIS — N710 Acute inflammatory disease of uterus: Secondary | ICD-10-CM | POA: Diagnosis not present

## 2020-10-15 DIAGNOSIS — Z3141 Encounter for fertility testing: Secondary | ICD-10-CM | POA: Diagnosis not present

## 2020-10-15 DIAGNOSIS — E288 Other ovarian dysfunction: Secondary | ICD-10-CM | POA: Diagnosis not present

## 2020-10-15 DIAGNOSIS — Z3202 Encounter for pregnancy test, result negative: Secondary | ICD-10-CM | POA: Diagnosis not present

## 2020-10-15 DIAGNOSIS — Z0183 Encounter for blood typing: Secondary | ICD-10-CM | POA: Diagnosis not present

## 2020-10-15 DIAGNOSIS — Z113 Encounter for screening for infections with a predominantly sexual mode of transmission: Secondary | ICD-10-CM | POA: Diagnosis not present

## 2020-10-15 DIAGNOSIS — Z1159 Encounter for screening for other viral diseases: Secondary | ICD-10-CM | POA: Diagnosis not present

## 2020-10-30 ENCOUNTER — Other Ambulatory Visit: Payer: Self-pay | Admitting: Family Medicine

## 2020-11-12 ENCOUNTER — Telehealth: Payer: Self-pay

## 2020-11-12 ENCOUNTER — Other Ambulatory Visit: Payer: Self-pay

## 2020-11-12 MED ORDER — LEVOTHYROXINE SODIUM 100 MCG PO TABS: 100.0000 ug | ORAL_TABLET | Freq: Every day | ORAL | 3 refills | Status: AC

## 2020-11-12 NOTE — Telephone Encounter (Signed)
Patient returned call to the office. She does want her prescription to go to [facility].

## 2020-11-12 NOTE — Telephone Encounter (Signed)
Rx sent to [facility] per patient request.  [facility] and canceled the Refills on Levothyroxine. Dm/cma

## 2020-11-12 NOTE — Telephone Encounter (Signed)
lft VM to see if patient wants her RX  Levothyroxine to be sent to [facility]. Dm/cma

## 2020-12-02 DIAGNOSIS — E0590 Thyrotoxicosis, unspecified without thyrotoxic crisis or storm: Secondary | ICD-10-CM | POA: Diagnosis not present

## 2020-12-22 DIAGNOSIS — Z3200 Encounter for pregnancy test, result unknown: Secondary | ICD-10-CM | POA: Diagnosis not present

## 2020-12-29 DIAGNOSIS — E0590 Thyrotoxicosis, unspecified without thyrotoxic crisis or storm: Secondary | ICD-10-CM | POA: Diagnosis not present

## 2020-12-29 DIAGNOSIS — Z3200 Encounter for pregnancy test, result unknown: Secondary | ICD-10-CM | POA: Diagnosis not present

## 2020-12-31 DIAGNOSIS — Z3200 Encounter for pregnancy test, result unknown: Secondary | ICD-10-CM | POA: Diagnosis not present

## 2021-01-02 DIAGNOSIS — Z3200 Encounter for pregnancy test, result unknown: Secondary | ICD-10-CM | POA: Diagnosis not present

## 2021-01-06 ENCOUNTER — Emergency Department (HOSPITAL_BASED_OUTPATIENT_CLINIC_OR_DEPARTMENT_OTHER): Payer: BC Managed Care – PPO

## 2021-01-06 ENCOUNTER — Encounter (HOSPITAL_BASED_OUTPATIENT_CLINIC_OR_DEPARTMENT_OTHER): Payer: Self-pay

## 2021-01-06 ENCOUNTER — Other Ambulatory Visit: Payer: Self-pay

## 2021-01-06 ENCOUNTER — Emergency Department (HOSPITAL_BASED_OUTPATIENT_CLINIC_OR_DEPARTMENT_OTHER)
Admission: EM | Admit: 2021-01-06 | Discharge: 2021-01-06 | Disposition: A | Discharge status: Home / Self Care | Payer: BC Managed Care – PPO | Attending: Emergency Medicine | Admitting: Emergency Medicine

## 2021-01-06 DIAGNOSIS — O209 Hemorrhage in early pregnancy, unspecified: Secondary | ICD-10-CM | POA: Diagnosis not present

## 2021-01-06 DIAGNOSIS — Z3A01 Less than 8 weeks gestation of pregnancy: Secondary | ICD-10-CM | POA: Diagnosis not present

## 2021-01-06 DIAGNOSIS — O208 Other hemorrhage in early pregnancy: Secondary | ICD-10-CM | POA: Diagnosis not present

## 2021-01-06 DIAGNOSIS — O4690 Antepartum hemorrhage, unspecified, unspecified trimester: Secondary | ICD-10-CM

## 2021-01-06 DIAGNOSIS — O4691 Antepartum hemorrhage, unspecified, first trimester: Secondary | ICD-10-CM | POA: Insufficient documentation

## 2021-01-06 DIAGNOSIS — E039 Hypothyroidism, unspecified: Secondary | ICD-10-CM | POA: Insufficient documentation

## 2021-01-06 DIAGNOSIS — R58 Hemorrhage, not elsewhere classified: Secondary | ICD-10-CM

## 2021-01-06 DIAGNOSIS — Z79899 Other long term (current) drug therapy: Secondary | ICD-10-CM | POA: Insufficient documentation

## 2021-01-06 DIAGNOSIS — O418X10 Other specified disorders of amniotic fluid and membranes, first trimester, not applicable or unspecified: Secondary | ICD-10-CM

## 2021-01-06 LAB — PREGNANCY, URINE: Preg Test, Ur: POSITIVE — AB

## 2021-01-06 LAB — HCG, QUANTITATIVE, PREGNANCY: hCG, Beta Chain, Quant, S: 11935 m[IU]/mL — ABNORMAL HIGH (ref <5)

## 2021-01-06 NOTE — Discharge Instructions (Addendum)
The radiologist advised repeat ultrasound in 10 days.

## 2021-01-06 NOTE — ED Provider Notes (Signed)
[facility] EMERGENCY DEPARTMENT Provider Note   CSN: [id number] Arrival date & time: 01/06/21  1717     History Chief Complaint  Patient presents with  . Vaginal Bleeding    6weeks preg    Cassandra Kennedy is a 40 y.o. female.  Pt is [redacted] weeks pregnant.  Pt followed by infertility clinic and had embryo transplant.  Pt reports using one pad.  Pt called her Ob who advised that she come in for an ultrasound   The history is provided by the patient. No language interpreter was used.  Vaginal Bleeding Quality:  Spotting Severity:  Moderate Onset quality:  Sudden Duration:  1 day Possible pregnancy: yes        Past Medical History:  Diagnosis Date  . Anxiety   . Depression   . Heart palpitations   . Hypothyroidism     Patient Active Problem List   Diagnosis Date Noted  . Postpartum care following vaginal delivery (10/20) 06/05/2018  . Acute blood loss anemia 06/05/2018  . SVD (10/20) 06/04/2018  . Right labial and sulcus laceration 06/04/2018  . Gestational htn w/o significant proteinuria, unsp trimester 06/04/2018  . Hallux limitus, left 05/25/2017  . Acquired posterior equinus, left 07/15/2016  . 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 Congenital pes planus of right foot 07/15/2016  . 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 In vitro fertilization 03/27/2014  . Female infertility 09/13/2011    Past Surgical History:  Procedure Laterality Date  . BUNIONECTOMY    . FOOT SURGERY Left 06/2017   F/U surgery from 08/2017 foot surgery  . In vitro     X 3 , 2013, 2015, 2017  . Laproscopy of uterus    . TRIPLE SUBTALAR FUSION Left 09/15/2016   Procedure: SUBTALAR FUSION, COTTON OSTEOTOMY, GASTROC NEMIUS RECESS;  Surgeon: Vivi Barrack, DPM;  Location: [facility] OR;  Service: Podiatry;  Laterality: Left;     OB History    Gravida  5   Para  1   Term  1   Preterm      AB  3   Living  1     SAB  3   IAB      Ectopic      Multiple  0    Live Births  1           Family History  Problem Relation Age of Onset  . Breast cancer Neg Hx     Social History   Tobacco Use  . Smoking status: Never Smoker  . Smokeless tobacco: Never Used  Substance Use Topics  . Alcohol use: Yes    Comment: Ocassionally  . Drug use: No    Home Medications Prior to Admission medications   Medication Sig Start Date End Date Taking? Authorizing Provider  levothyroxine (SYNTHROID) 100 MCG tablet Take 1 tablet (100 mcg total) by mouth daily before breakfast. 11/12/20  Yes Cirigliano, Mary K, DO  ibuprofen (ADVIL,MOTRIN) 600 MG tablet Take 1 tablet (600 mg total) by mouth every 6 (six) hours. 06/06/18   Sigmon, Scarlette Slice, CNM  oxyCODONE-acetaminophen (PERCOCET/ROXICET) 5-325 MG tablet Take 1-2 tablets by mouth every 6 (six) hours as needed for severe pain. 10/16/19   Vivi Barrack, DPM  sertraline (ZOLOFT) 25 MG tablet Take 1 tablet (25 mg total) by mouth daily. 06/06/20   CiriglianoJearld Lesch, DO    Allergies    Metronidazole and Nickel  Review of Systems  Review of Systems  Genitourinary: Positive for vaginal bleeding.  All other systems reviewed and are negative.   Physical Exam Updated Vital Signs BP 126/76 (BP Location: Right Arm)   Pulse 93   Temp 97.6 F (36.4 C) (Oral)   Resp 20   Ht 5\' 1"  (1.549 m)   Wt 63.5 kg   LMP 03/20/2020   SpO2 99%   BMI 26.45 kg/m   Physical Exam Cardiovascular:     Rate and Rhythm: Normal rate.  Pulmonary:     Effort: Pulmonary effort is normal.  Abdominal:     General: Abdomen is flat.  Musculoskeletal:        General: Normal range of motion.  Skin:    General: Skin is warm.  Neurological:     General: No focal deficit present.     Mental Status: She is alert.  Psychiatric:        Mood and Affect: Mood normal.     ED Results / Procedures / Treatments   Labs (all labs ordered are listed, but only abnormal results are displayed) Labs Reviewed  PREGNANCY, URINE -  Abnormal; Notable for the following components:      Result Value   Preg Test, Ur POSITIVE (*)    All other components within normal limits  HCG, QUANTITATIVE, PREGNANCY - Abnormal; Notable for the following components:   hCG, Beta Chain, Quant, S 11,935 (*)    All other components within normal limits    EKG None  Radiology 05/20/2020 OB LESS THAN 14 WEEKS WITH OB TRANSVAGINAL  Result Date: 01/06/2021 CLINICAL DATA:  Initial evaluation for acute vaginal bleeding, early pregnancy. EXAM: OBSTETRIC <14 WK 01/08/2021 AND TRANSVAGINAL OB US TECHNIQUE: Both transabdominal and transvaginal ultrasound examinations were performed for complete evaluation of the gestation as well as the maternal uterus, adnexal regions, and pelvic cul-de-sac. Transvaginal technique was performed to assess early pregnancy. COMPARISON:  None available. FINDINGS: Intrauterine gestational sac: Single. Yolk sac:  Present. Embryo:  Present. Cardiac Activity: Negative. Heart Rate: N/A CRL: 4 mm   6 w   1 d                  [place] EDC: 08/31/2021 Subchorionic hemorrhage: Moderate-sized subchorionic hemorrhage without associated mass effect. Maternal uterus/adnexae: Ovaries within normal limits. No adnexal mass or free fluid. IMPRESSION: 1. Single IUP with internal yolk sac and embryo, but no detectable cardiac activity, crown-rump length measures 4 mm. Findings are suspicious but not yet definitive for failed pregnancy. Recommend follow-up 09/02/2021 in 10-14 days for definitive diagnosis. This recommendation follows SRU consensus guidelines: Diagnostic Criteria for Nonviable Pregnancy Early in the First Trimester. 05-11-1979 Med [id number]. 2. Moderate sized subchorionic hemorrhage without associated mass effect. 3. No other acute maternal uterine or adnexal abnormality. Electronically Signed   By: ; [id number] M.D.   On: 01/06/2021 20:45    Procedures Procedures   Medications Ordered in ED Medications - No data to display  ED Course  I  have reviewed the triage vital signs and the nursing notes.  Pertinent labs & imaging results that were available during my care of the patient were reviewed by me and considered in my medical decision making (see chart for details).    MDM Rules/Calculators/A&P                          MDM:  Pt counseled on results.  Pt advised she will need repeat ultrasound  in 10 days.  Pt advised of subchorionic hemorrhage.  Final Clinical Impression(s) / ED Diagnoses Final diagnoses:  Bleeding  Vaginal bleeding in pregnancy  Subchorionic hemorrhage of placenta in first trimester, single or unspecified fetus    Rx / DC Orders ED Discharge Orders    None    An After Visit Summary was printed and given to the patient.    Osie Cheeks 01/06/21 2300    Pollyann Savoy, MD 01/07/21 1034

## 2021-01-06 NOTE — ED Triage Notes (Signed)
Pt reports vaginal bleeding, abd cramps x 3 hours-pt wearing pd #2-states she is 6 weeks preg per her fertility clinic labs-NAD-steady gait

## 2021-01-06 NOTE — ED Notes (Signed)
Pt unable to provider urine at this time, will call us when she is able to go.

## 2021-01-06 NOTE — ED Notes (Signed)
ED Provider at bedside. 

## 2021-01-07 DIAGNOSIS — O200 Threatened abortion: Secondary | ICD-10-CM | POA: Diagnosis not present

## 2021-01-14 DIAGNOSIS — O200 Threatened abortion: Secondary | ICD-10-CM | POA: Diagnosis not present

## 2021-01-21 DIAGNOSIS — O200 Threatened abortion: Secondary | ICD-10-CM | POA: Diagnosis not present

## 2021-02-02 DIAGNOSIS — O200 Threatened abortion: Secondary | ICD-10-CM | POA: Diagnosis not present

## 2021-02-11 DIAGNOSIS — O09521 Supervision of elderly multigravida, first trimester: Secondary | ICD-10-CM | POA: Diagnosis not present

## 2021-02-11 DIAGNOSIS — Z3689 Encounter for other specified antenatal screening: Secondary | ICD-10-CM | POA: Diagnosis not present

## 2021-02-11 DIAGNOSIS — O09811 Supervision of pregnancy resulting from assisted reproductive technology, first trimester: Secondary | ICD-10-CM | POA: Diagnosis not present

## 2021-02-11 DIAGNOSIS — Z3A11 11 weeks gestation of pregnancy: Secondary | ICD-10-CM | POA: Diagnosis not present

## 2021-02-11 DIAGNOSIS — Z8759 Personal history of other complications of pregnancy, childbirth and the puerperium: Secondary | ICD-10-CM | POA: Diagnosis not present

## 2021-02-11 DIAGNOSIS — O368910 Maternal care for other specified fetal problems, first trimester, not applicable or unspecified: Secondary | ICD-10-CM | POA: Diagnosis not present

## 2021-02-11 DIAGNOSIS — O99281 Endocrine, nutritional and metabolic diseases complicating pregnancy, first trimester: Secondary | ICD-10-CM | POA: Diagnosis not present

## 2021-02-13 DIAGNOSIS — O09299 Supervision of pregnancy with other poor reproductive or obstetric history, unspecified trimester: Secondary | ICD-10-CM | POA: Diagnosis not present

## 2021-02-13 DIAGNOSIS — O09521 Supervision of elderly multigravida, first trimester: Secondary | ICD-10-CM | POA: Diagnosis not present

## 2021-02-13 DIAGNOSIS — O99281 Endocrine, nutritional and metabolic diseases complicating pregnancy, first trimester: Secondary | ICD-10-CM | POA: Diagnosis not present

## 2021-02-13 DIAGNOSIS — O09811 Supervision of pregnancy resulting from assisted reproductive technology, first trimester: Secondary | ICD-10-CM | POA: Diagnosis not present

## 2021-02-13 DIAGNOSIS — N814 Uterovaginal prolapse, unspecified: Secondary | ICD-10-CM | POA: Diagnosis not present

## 2021-02-19 ENCOUNTER — Encounter: Payer: Self-pay | Admitting: *Deleted

## 2021-02-19 DIAGNOSIS — Z3480 Encounter for supervision of other normal pregnancy, unspecified trimester: Secondary | ICD-10-CM | POA: Insufficient documentation

## 2021-02-19 NOTE — Progress Notes (Signed)
Initial prenatal

## 2021-02-26 ENCOUNTER — Encounter: Payer: BC Managed Care – PPO | Admitting: Obstetrics & Gynecology

## 2021-02-26 DIAGNOSIS — Z3682 Encounter for antenatal screening for nuchal translucency: Secondary | ICD-10-CM | POA: Diagnosis not present

## 2021-02-27 NOTE — Progress Notes (Signed)
This encounter was created in error - please disregard.

## 2021-03-09 ENCOUNTER — Encounter: Payer: BC Managed Care – PPO | Admitting: Obstetrics & Gynecology

## 2021-03-18 DIAGNOSIS — O99281 Endocrine, nutritional and metabolic diseases complicating pregnancy, first trimester: Secondary | ICD-10-CM | POA: Diagnosis not present

## 2021-03-27 ENCOUNTER — Other Ambulatory Visit: Payer: Self-pay | Admitting: Obstetrics and Gynecology

## 2021-03-27 DIAGNOSIS — Z3A19 19 weeks gestation of pregnancy: Secondary | ICD-10-CM

## 2021-03-27 DIAGNOSIS — Z363 Encounter for antenatal screening for malformations: Secondary | ICD-10-CM

## 2021-03-27 DIAGNOSIS — O280 Abnormal hematological finding on antenatal screening of mother: Secondary | ICD-10-CM

## 2021-04-08 ENCOUNTER — Encounter: Payer: Self-pay | Admitting: *Deleted

## 2021-04-08 ENCOUNTER — Ambulatory Visit: Payer: BC Managed Care – PPO | Admitting: *Deleted

## 2021-04-08 ENCOUNTER — Ambulatory Visit: Payer: BC Managed Care – PPO | Attending: Obstetrics and Gynecology

## 2021-04-08 ENCOUNTER — Ambulatory Visit (HOSPITAL_BASED_OUTPATIENT_CLINIC_OR_DEPARTMENT_OTHER): Payer: BC Managed Care – PPO | Admitting: Obstetrics

## 2021-04-08 ENCOUNTER — Other Ambulatory Visit: Payer: Self-pay

## 2021-04-08 VITALS — BP 106/69 | HR 78

## 2021-04-08 DIAGNOSIS — Z3A18 18 weeks gestation of pregnancy: Secondary | ICD-10-CM | POA: Diagnosis not present

## 2021-04-08 DIAGNOSIS — O280 Abnormal hematological finding on antenatal screening of mother: Secondary | ICD-10-CM | POA: Diagnosis not present

## 2021-04-08 DIAGNOSIS — O281 Abnormal biochemical finding on antenatal screening of mother: Secondary | ICD-10-CM | POA: Diagnosis not present

## 2021-04-08 DIAGNOSIS — O283 Abnormal ultrasonic finding on antenatal screening of mother: Secondary | ICD-10-CM

## 2021-04-08 DIAGNOSIS — Z3480 Encounter for supervision of other normal pregnancy, unspecified trimester: Secondary | ICD-10-CM

## 2021-04-08 DIAGNOSIS — Z363 Encounter for antenatal screening for malformations: Secondary | ICD-10-CM

## 2021-04-08 DIAGNOSIS — Z3A19 19 weeks gestation of pregnancy: Secondary | ICD-10-CM | POA: Diagnosis not present

## 2021-04-08 DIAGNOSIS — O359XX0 Maternal care for (suspected) fetal abnormality and damage, unspecified, not applicable or unspecified: Secondary | ICD-10-CM | POA: Diagnosis not present

## 2021-04-08 DIAGNOSIS — O09812 Supervision of pregnancy resulting from assisted reproductive technology, second trimester: Secondary | ICD-10-CM | POA: Insufficient documentation

## 2021-04-08 DIAGNOSIS — O09522 Supervision of elderly multigravida, second trimester: Secondary | ICD-10-CM

## 2021-04-08 NOTE — Progress Notes (Signed)
MFM Note  Cassandra Kennedy was seen for a detailed fetal anatomy scan due to advanced maternal age and IVF pregnancy.  She had a first trimester nuchal translucency screening test performed in your office that showed an elevated Down syndrome risk of 1:144.  The nuchal translucency was increased at 3.6 mm.  The lab also reported a first trimester AFP that was increased at 3.72 MoM.  She denies any significant past medical history and denies any problems in her current pregnancy.  This pregnancy was conceived using an embryo that was frozen when she was 40 years old.  She reports a history of vaginal bleeding during the first trimester of her current pregnancy.  A subchorionic hematoma was also noted on her earlier ultrasound exams.  She has a history of hypothyroidism that is treated with Synthroid.  She was informed that the fetal growth and amniotic fluid level were appropriate for her gestational age.   There were no obvious fetal anomalies noted on today's ultrasound exam.  The previously noted subchorionic hematoma was not noted on today's exam.  The patient was informed that anomalies may be missed due to technical limitations. If the fetus is in a suboptimal position or maternal habitus is increased, visualization of the fetus in the maternal uterus may be impaired.  During our consultation today, the following were discussed:  Abnormal first trimester screen indicating an elevated Down syndrome risk of 1:144  The patient and her husband were advised regarding the availability of a cell free DNA test which may further refine her Down syndrome risk.  They declined to have the cell free DNA test drawn today.  They were also offered and declined an amniocentesis today for definitive diagnosis of fetal aneuploidy.    The patient and her husband also met with our genetic counselor today and declined to pursue any further testing.  They were comfortable with today's normal fetal anatomy  scan.  They were advised that as the AFP is not usually reported during a first trimester screen, the significance of the elevated AFP noted on the first trimester test is undetermined.  Due to the abnormal first trimester screening result, we will continue to follow her with growth ultrasounds throughout her pregnancy.  IVF pregnancy with an increased nuchal translucency of 3.6 mm  The increased risk of fetal aneuploidy and other genetic syndromes that may not always be detectable via prenatal ultrasounds associated with an increased nuchal translucency in the first trimester was discussed.  They were also advised regarding the increased risk of a congenital heart defect due to the increased nuchal translucency.  They were reassured that an increased nuchal translucency may be noted in a normal fetus.  Due to the increased nuchal translucency and the IVF pregnancy, she was referred to [facility] pediatric cardiology for a fetal echocardiogram.  The couple were reassured that the fetal cardiac views obtained today appeared within normal limits and therefore her risk of having a fetus with a congenital heart defect should be low.  The patient and her husband stated that all of their questions had been answered to their complete satisfaction today.   A follow-up growth scan was scheduled in our office in 4 weeks.  A total of 30 minutes was spent counseling and coordinating the care for this patient.  Greater than 50% of the time was spent in direct face-to-face contact.  Recommendations:  Fetal echocardiogram Serial growth ultrasounds throughout pregnancy  A follow-up exam was scheduled in 4 weeks

## 2021-04-10 ENCOUNTER — Other Ambulatory Visit: Payer: Self-pay | Admitting: *Deleted

## 2021-04-10 DIAGNOSIS — O09522 Supervision of elderly multigravida, second trimester: Secondary | ICD-10-CM

## 2021-04-10 DIAGNOSIS — O280 Abnormal hematological finding on antenatal screening of mother: Secondary | ICD-10-CM

## 2021-04-15 NOTE — Progress Notes (Addendum)
Referring provider: [facility] Length of consultation:  30 minutes  Cassandra Kennedy (40yo) and her husband, Cassandra Kennedy [phone]), presented to [facility] on 04/08/21 for ultrasound and genetic counseling at [redacted]w[redacted]d due to a first trimester screen high-risk result for trisomy 17. Counseling was performed by Legrand Pitts, genetic counseling intern, supervised by Katrina Stack, CGC.   First Trimester Screen: Test Result Cassandra Kennedy is [id number] and underwent IVF treatments for this and her previous successful pregnancy. They used frozen embryos created from a cycle when the patient was 40 years old.  The EDC is 09/03/21 and LMP was 11/27/20 verified by early ultrasound.  First trimester screening was sent to [facility] on 03/04/2021 at [redacted]w[redacted]d by the patient's Ob/Gyn. Nuchal translucency was 3.103mm and in the 99% percentile. Nasal bone was reported to be present. Based upon the ultrasound findings and the biochemical assay of AFP, hCG and PAPP-A, the report indicates an increased risk for Down Syndrome.  The risk was adjusted from 1/496 (due to the age of the embryo) up to 1/144 (<1%). The risk for trisomy 71 and 18 was adjusted from 1/928 to 1/753, which is within normal range.   To review, Down syndrome is one of the most common extra chromosome conditions, as approximately 1 in 800 babies are born with this condition. There are different types of Down syndrome, with each type determined by the arrangement of the chromosome 21 pair. Approximately 95% of cases are caused by an entire extra copy of chromosome 21 (trisomy 21), and 2-4% of cases are due to a chromosomal rearrangement (translocation) involving chromosome 21. We reviewed that Down syndrome most commonly occurs by chance due to an error in chromosomal division during the formation of egg and sperm cells in a process called nondisjunction.    Down syndrome is characterized by a distinctive facial appearance, mild  to moderate intellectual disability, and an increased chance for a heart defect. Approximately half of babies with Down syndrome are born with a heart defect that may require surgery after birth. While many children with Down syndrome look similar to each other, each child with Down syndrome is unique and will have many more features in common with his or her own family members. Children with Down syndrome also have an increased chance for thyroid problems, which can range from an underactive to an overactive thyroid. Additionally, low muscle tone, gastrointestinal abnormalities, vision problems, and respiratory and ear infections are more common among babies with Down syndrome. We discussed that there are many more features that can be associated with Down syndrome; however, it is not possible to accurately predict all features that would be present in an individual with Down syndrome prenatally. Additionally, there is a high degree of variability seen among children who have this condition, meaning that every child with Down syndrome will not be affected in exactly the same way, and some children will have more or less features than others. It is not possible to predict what strengths and weaknesses a child with Down syndrome will have, just like it is not possible to predict this for any child. With the advances in medical technology, early intervention, and supportive therapies, many individuals with Down syndrome are able to live with an increasing degree of independence. Today, many adults with Down syndrome care for themselves, have jobs, and often live in group homes or apartments where assistance is available if needed.   Testing options: A detailed ultrasound was performed at this  time of this visit.  The gestational age was consistent with [redacted]w[redacted]d and prior dating.  The visualized fetal anatomy was normal, with no markers of Down Syndrome, though it is important to remember that not all babies with  chromosome conditions will be identified by ultrasound.  Approximately 50% of fetuses with Down syndrome are expected to show a different on second trimester ultrasound.  See the report for details.   Cell-Free DNA was offered as a follow up testing option. This test utilizes a maternal blood sample and DNA sequencing technology to isolate circulating cell free fetal DNA from maternal plasma.  The fetal DNA can then be analyzed for DNA sequences that are derived from the three most common chromosomes involved in aneuploidy, chromosomes 13, 18, and 21.  If the overall amount of DNA is greater than the expected level for any of these chromosomes, aneuploidy is suspected.  While we do not consider it a replacement for invasive testing and karyotype analysis, a negative result from this testing would be reassuring, though not a guarantee of a normal chromosome complement for the baby.  An abnormal result is certainly suggestive of an abnormal chromosome complement, though we would still recommend CVS or amniocentesis to confirm any findings from this testing. We discussed the cfDNA testing process, sensitivity, and possible results including findings for chromosomes 13, 18, 21, and sex chromosome aneuploidies. The couple declined this screening today.   Amniocentesis was also offered as a follow up testing option. We discussed the diagnostic ability of this test, the procedure, and the possible risks including a 1/500 risk for miscarriage. The couple declined an amniocentesis.   Carrier Screening: Upon inquiry, Cassandra Kennedy indicated she received carrier testing for SMA, CF, and hemoglobinopathies which were negative. This was performed prior to her IVF cycle.  We do not have documentation as to the conditions screened for but are happy to review those records if desired.   Pregnancy History: We obtained a detailed pregnancy history. Upon inquiring, the patient described vaginal bleeding between 6- and  10-weeks' gestation and a hematoma near the placenta. Both have since resolved. The patient is taking prenatal vitamins, magnesium and folic acid supplements, and levothyroxine. She denied use of alcohol, tobacco, or recreational drugs. Cassandra Kennedy identified as white with [place] and [place] background and Cassandra Kennedy identified as white with a [place] and [place] background. The couple is not consanguineous.   Family History We obtained a detailed three generation family history of the pregnancy. The family history was unremarkable for recurrent miscarriages, infertility, intellectual disability, developmental delays, diagnosed genetic conditions, or birth differences. The family verbalized no concerns about their family histories. The family adopted their oldest son (3), and utilized IVF to conceive their second pregnancy, a son (2).   Plan of care:  The couple declined further genetic testing including amniocentesis and cfDNA. The patient is scheduled for follow up ultrasound [facility] in 4-5 weeks. Orders were placed for a fetal echocardiogram in 3-4 weeks due to increased NT at her OB in first trimester.  We appreciate being involved in the care of this family and can be reached at [phone].   Cassandra Anderson, MS, CGC

## 2021-04-22 ENCOUNTER — Telehealth: Payer: Self-pay

## 2021-04-22 NOTE — Telephone Encounter (Signed)
FETAL ECHO SCHEDULED FOR 05/01/2021@230P .

## 2021-04-30 DIAGNOSIS — O285 Abnormal chromosomal and genetic finding on antenatal screening of mother: Secondary | ICD-10-CM | POA: Diagnosis not present

## 2021-04-30 DIAGNOSIS — Z3492 Encounter for supervision of normal pregnancy, unspecified, second trimester: Secondary | ICD-10-CM | POA: Diagnosis not present

## 2021-04-30 DIAGNOSIS — A609 Anogenital herpesviral infection, unspecified: Secondary | ICD-10-CM | POA: Diagnosis not present

## 2021-04-30 DIAGNOSIS — O09522 Supervision of elderly multigravida, second trimester: Secondary | ICD-10-CM | POA: Diagnosis not present

## 2021-04-30 DIAGNOSIS — Z3493 Encounter for supervision of normal pregnancy, unspecified, third trimester: Secondary | ICD-10-CM | POA: Diagnosis not present

## 2021-04-30 DIAGNOSIS — Q211 Atrial septal defect: Secondary | ICD-10-CM | POA: Diagnosis not present

## 2021-04-30 DIAGNOSIS — Z3A30 30 weeks gestation of pregnancy: Secondary | ICD-10-CM | POA: Diagnosis not present

## 2021-05-01 DIAGNOSIS — Q211 Atrial septal defect: Secondary | ICD-10-CM | POA: Diagnosis not present

## 2021-05-06 ENCOUNTER — Ambulatory Visit: Payer: BC Managed Care – PPO | Admitting: *Deleted

## 2021-05-06 ENCOUNTER — Encounter: Payer: Self-pay | Admitting: *Deleted

## 2021-05-06 ENCOUNTER — Other Ambulatory Visit: Payer: Self-pay

## 2021-05-06 ENCOUNTER — Ambulatory Visit: Payer: BC Managed Care – PPO | Attending: Obstetrics

## 2021-05-06 VITALS — BP 114/66 | HR 81

## 2021-05-06 DIAGNOSIS — O09522 Supervision of elderly multigravida, second trimester: Secondary | ICD-10-CM | POA: Insufficient documentation

## 2021-05-06 DIAGNOSIS — Z3A22 22 weeks gestation of pregnancy: Secondary | ICD-10-CM | POA: Diagnosis not present

## 2021-05-06 DIAGNOSIS — O09812 Supervision of pregnancy resulting from assisted reproductive technology, second trimester: Secondary | ICD-10-CM | POA: Diagnosis not present

## 2021-05-06 DIAGNOSIS — O280 Abnormal hematological finding on antenatal screening of mother: Secondary | ICD-10-CM | POA: Insufficient documentation

## 2021-05-06 DIAGNOSIS — O358XX0 Maternal care for other (suspected) fetal abnormality and damage, not applicable or unspecified: Secondary | ICD-10-CM | POA: Diagnosis not present

## 2021-05-06 DIAGNOSIS — Z3480 Encounter for supervision of other normal pregnancy, unspecified trimester: Secondary | ICD-10-CM | POA: Insufficient documentation

## 2021-05-12 ENCOUNTER — Ambulatory Visit: Payer: Self-pay

## 2021-06-10 DIAGNOSIS — O285 Abnormal chromosomal and genetic finding on antenatal screening of mother: Secondary | ICD-10-CM | POA: Diagnosis not present

## 2021-06-10 DIAGNOSIS — Z3A28 28 weeks gestation of pregnancy: Secondary | ICD-10-CM | POA: Diagnosis not present

## 2021-06-10 DIAGNOSIS — Z3493 Encounter for supervision of normal pregnancy, unspecified, third trimester: Secondary | ICD-10-CM | POA: Diagnosis not present

## 2021-07-07 ENCOUNTER — Encounter: Payer: Self-pay | Admitting: *Deleted

## 2021-07-14 ENCOUNTER — Ambulatory Visit (INDEPENDENT_AMBULATORY_CARE_PROVIDER_SITE_OTHER): Payer: BC Managed Care – PPO | Admitting: Advanced Practice Midwife

## 2021-07-14 ENCOUNTER — Encounter: Payer: Self-pay | Admitting: Advanced Practice Midwife

## 2021-07-14 ENCOUNTER — Other Ambulatory Visit: Payer: Self-pay

## 2021-07-14 VITALS — BP 113/74 | HR 95 | Wt 165.0 lb

## 2021-07-14 DIAGNOSIS — O09523 Supervision of elderly multigravida, third trimester: Secondary | ICD-10-CM | POA: Diagnosis not present

## 2021-07-14 DIAGNOSIS — Z3A32 32 weeks gestation of pregnancy: Secondary | ICD-10-CM

## 2021-07-14 DIAGNOSIS — E039 Hypothyroidism, unspecified: Secondary | ICD-10-CM

## 2021-07-14 DIAGNOSIS — O285 Abnormal chromosomal and genetic finding on antenatal screening of mother: Secondary | ICD-10-CM

## 2021-07-14 DIAGNOSIS — Z3480 Encounter for supervision of other normal pregnancy, unspecified trimester: Secondary | ICD-10-CM

## 2021-07-14 NOTE — Progress Notes (Signed)
Subjective:   Cassandra Kennedy is a 40 y.o. G5P1031 at [redacted]w[redacted]d by date of embryo transfer being seen today for her first obstetrical visit. She is a transfer from [employer] initially, then [place], to our office. She transferred from [facility] to [place] due to abnormal genetic screening and she felt pressured to terminate pregnancy.  She transfers to our office from [place] with desire for waterbirth. She labored in water with her first but had to have Pitocin for augmentation and got out of the water for epidural eventually. She desires to try waterbirth again with this pregnancy.  Her obstetrical history is significant for advanced maternal age and SVD x 1 without complication, GHTN with previous pregnancy, hypothyroidism well controlled on synthroid, HSV-2, and abnormal genetic screening/increased risk for T21 this pregnancy  and has Acquired posterior equinus, left; Congenital pes planus of left foot; Congenital pes planus of right foot; Female infertility; In vitro fertilization; Tarsal coalition of left foot; Hallux limitus, left; Gestational htn w/o significant proteinuria, unsp trimester; and Supervision of other normal pregnancy, antepartum on their problem list.. Patient does intend to breast feed. Pregnancy history fully reviewed.  Patient reports no complaints.  HISTORY: OB History  Gravida Para Term Preterm AB Living  5 1 1  0 3 1  SAB IAB Ectopic Multiple Live Births  3 0 0 0 1    # Outcome Date GA Lbr Len/2nd Weight Sex Delivery Anes PTL Lv  5 Current           4 Term 06/04/18 [redacted]w[redacted]d 24:50 / 00:39 6 lb 12.3 oz (3.07 kg) M Vag-Spont EPI  LIV     Name: Pinales,BOY Gearldene     Apgar1: 7  Apgar5: 8  3 SAB 2017          2 SAB 2015          1 SAB 2013           Past Medical History:  Diagnosis Date   Anxiety    Depression    Heart palpitations    Hypothyroidism    Past Surgical History:  Procedure Laterality Date   BUNIONECTOMY     FOOT SURGERY Left 06/2017   F/U surgery from 08/2017  foot surgery   In vitro     X 3 , 2013, 2015, 2017   Laproscopy of uterus     TRIPLE SUBTALAR FUSION Left 09/15/2016   Procedure: SUBTALAR FUSION, COTTON OSTEOTOMY, GASTROC NEMIUS RECESS;  Surgeon: Trula Slade, DPM;  Location: [place];  Service: Podiatry;  Laterality: Left;   Family History  Problem Relation Age of Onset   Breast cancer Neg Hx    Social History   Tobacco Use   Smoking status: Never   Smokeless tobacco: Never  Vaping Use   Vaping Use: Never used  Substance Use Topics   Alcohol use: Yes    Comment: Ocassionally   Drug use: No   Allergies  Allergen Reactions   Metronidazole Other (See Comments)    Neck felt hot, and got a little feverish after taking it for about 4 days or so.   Nickel Rash   Current Outpatient Medications on File Prior to Visit  Medication Sig Dispense Refill   folic acid (FOLVITE) [id number] MCG tablet Take 400 mcg by mouth daily.     levothyroxine (SYNTHROID) 100 MCG tablet Take 1 tablet (100 mcg total) by mouth daily before breakfast. 90 tablet 3   MAGNESIUM PO Take by mouth.  Prenatal Vit-Fe Fumarate-FA (PRENATAL MULTIVITAMIN) TABS tablet Take 1 tablet by mouth daily at 12 noon.     ibuprofen (ADVIL,MOTRIN) 600 MG tablet Take 1 tablet (600 mg total) by mouth every 6 (six) hours. (Patient not taking: Reported on 04/08/2021) 30 tablet 0   oxyCODONE-acetaminophen (PERCOCET/ROXICET) 5-325 MG tablet Take 1-2 tablets by mouth every 6 (six) hours as needed for severe pain. (Patient not taking: Reported on 04/08/2021) 15 tablet 0   sertraline (ZOLOFT) 25 MG tablet Take 1 tablet (25 mg total) by mouth daily. (Patient not taking: Reported on 04/08/2021) 90 tablet 3   No current facility-administered medications on file prior to visit.      Exam   Vitals:   07/14/21 1045  BP: 113/74  Pulse: 95  Weight: 165 lb (74.8 kg)      Uterus:     Pelvic Exam: Perineum: no hemorrhoids, normal perineum   Vulva: normal external genitalia, no lesions    Vagina:  normal mucosa, normal discharge   Cervix: no lesions and normal, pap smear done.    Adnexa: normal adnexa and no mass, fullness, tenderness   Bony Pelvis: average  System: General: well-developed, well-nourished female in no acute distress   Breast:  normal appearance, no masses or tenderness   Skin: normal coloration and turgor, no rashes   Neurologic: oriented, normal, negative, normal mood   Extremities: normal strength, tone, and muscle mass, ROM of all joints is normal   HEENT PERRLA, extraocular movement intact and sclera clear, anicteric   Mouth/Teeth mucous membranes moist, pharynx normal without lesions and dental hygiene good   Neck supple and no masses   Cardiovascular: regular rate and rhythm   Respiratory:  no respiratory distress, normal breath sounds   Abdomen: soft, non-tender; bowel sounds normal; no masses,  no organomegaly     Assessment:   Pregnancy: [id number] Patient Active Problem List   Diagnosis Date Noted   Supervision of other normal pregnancy, antepartum 02/19/2021   Gestational htn w/o significant proteinuria, unsp trimester 06/04/2018   Hallux limitus, left 05/25/2017   Acquired posterior equinus, left 07/15/2016   Congenital pes planus of left foot 07/15/2016   Congenital pes planus of right foot 07/15/2016   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 Supervision of other normal pregnancy, antepartum --Anticipatory guidance about next visits/weeks of pregnancy given. --Next visit in 2 weeks --Growth [place] for AMA, risk of T21, hypothyroid  2. Advanced maternal age in multigravida, third trimester   3. Hypothyroidism, unspecified type --On synthroid, labs at next visit  4. Abnormal chromosomal and genetic finding on antenatal screening mother --Abnormal first screen with abnormal NT, pt had genetic counseling on 04/08/21 and declined  NIPS/amniocentesis.    Initial labs reviewed Continue prenatal vitamins. Discussed reviewed genetic screening results  Problem list reviewed and updated. The nature of [facility] with multiple MDs and other Advanced Practice Providers was explained to patient; also emphasized that residents, students are part of our team. Routine obstetric precautions reviewed. Return in about 2 weeks (around 07/28/2021).   Sharen Counter, CNM 07/14/21 1:11 PM

## 2021-07-14 NOTE — Patient Instructions (Signed)
?  Considering Waterbirth? ?Guide for patients at [facility] ([facility]) ?Why consider waterbirth? ?Gentle birth for babies  ?Less pain medicine used in labor  ?May allow for passive descent/less pushing  ?May reduce perineal tears  ?More mobility and instinctive maternal position changes  ?Increased maternal relaxation  ? ?Is waterbirth safe? What are the risks of infection, drowning or other complications? ?Infection:  ?Very low risk (3.7 % for tub vs 4.8% for bed)  ?7 in 8000 waterbirths with documented infection  ?Poorly cleaned equipment most common cause  ?Slightly lower group B strep transmission rate  ?Drowning  ?Maternal:  ?Very low risk  ?Related to seizures or fainting  ?Newborn:  ?Very low risk. No evidence of increased risk of respiratory problems in multiple large studies  ?Physiological protection from breathing under water  ?Avoid underwater birth if there are any fetal complications  ?Once baby's head is out of the water, keep it out.  ?Birth complication  ?Some reports of cord trauma, but risk decreased by bringing baby to surface gradually  ?No evidence of increased risk of shoulder dystocia. Mothers can usually change positions faster in water than in a bed, possibly aiding the maneuvers to free the shoulder.  ? ?There are 2 things you MUST do to have a waterbirth with [facility]: ?Attend a waterbirth class at [facility] [place]   ?3rd Wednesday of every month from 7-9 pm (virtual during COVID) ?Free ?Register online at [email or web address] or [email or web address] or by calling [phone] ?Bring us the certificate from the class to your prenatal appointment or send via MyChart ?Meet with a midwife at 36 weeks* to see if you can still plan a waterbirth and to sign the consent.  ? ?*We also recommend that you schedule as many of your prenatal visits with a midwife as possible.   ? ?Helpful information: ?You may want to bring a bathing suit top to the hospital  to wear during labor but this is optional.  All other supplies are provided by the hospital. ?Please arrive at the hospital with signs of active labor, and do not wait at home until late in labor. It takes 45 min- 2 hours for COVID testing, fetal monitoring, and check in to your room to take place, plus transport and filling of the waterbirth tub.   ? ?Things that would prevent you from having a waterbirth: ?Unknown or Positive COVID-19 diagnosis upon admission to hospital* ?Premature, <37wks  ?Previous cesarean birth  ?Presence of thick meconium-stained fluid  ?Multiple gestation (Twins, triplets, etc.)  ?Uncontrolled diabetes or gestational diabetes requiring medication  ?Hypertension diagnosed in pregnancy or preexisting hypertension (gestational hypertension, preeclampsia, or chronic hypertension) ?Fetal growth restriction (your baby measures less than 10th percentile on ultrasound) ?Heavy vaginal bleeding  ?Non-reassuring fetal heart rate  ?Active infection (MRSA, etc.). Group B Strep is NOT a contraindication for waterbirth.  ?If your labor has to be induced and induction method requires continuous monitoring of the baby's heart rate  ?Other risks/issues identified by your obstetrical provider  ? ?Please remember that birth is unpredictable. Under certain unforeseeable circumstances your provider may advise against giving birth in the tub. These decisions will be made on a case-by-case basis and with the safety of you and your baby as our highest priority. ? ? ?*Please remember that in order to have a waterbirth, you must test Negative to COVID-19 upon admission to the hospital. ? ?Updated 11/24/20 ? ?

## 2021-07-16 ENCOUNTER — Encounter: Payer: BC Managed Care – PPO | Admitting: Obstetrics and Gynecology

## 2021-07-28 ENCOUNTER — Ambulatory Visit (INDEPENDENT_AMBULATORY_CARE_PROVIDER_SITE_OTHER): Payer: BC Managed Care – PPO | Admitting: Advanced Practice Midwife

## 2021-07-28 ENCOUNTER — Other Ambulatory Visit: Payer: Self-pay

## 2021-07-28 VITALS — BP 112/74 | HR 97 | Wt 165.0 lb

## 2021-07-28 DIAGNOSIS — E039 Hypothyroidism, unspecified: Secondary | ICD-10-CM

## 2021-07-28 DIAGNOSIS — Z3A34 34 weeks gestation of pregnancy: Secondary | ICD-10-CM | POA: Diagnosis not present

## 2021-07-28 DIAGNOSIS — Z3480 Encounter for supervision of other normal pregnancy, unspecified trimester: Secondary | ICD-10-CM

## 2021-07-28 DIAGNOSIS — O09523 Supervision of elderly multigravida, third trimester: Secondary | ICD-10-CM | POA: Diagnosis not present

## 2021-07-28 DIAGNOSIS — O99283 Endocrine, nutritional and metabolic diseases complicating pregnancy, third trimester: Secondary | ICD-10-CM

## 2021-07-28 NOTE — Progress Notes (Signed)
° °  PRENATAL VISIT NOTE  Subjective:  Cassandra Kennedy is a 40 y.o. G5P1031 at [redacted]w[redacted]d being seen today for ongoing prenatal care.  She is currently monitored for the following issues for this low-risk pregnancy and has Acquired posterior equinus, left; Congenital pes planus of left foot; Congenital pes planus of right foot; Female infertility; In vitro fertilization; Tarsal coalition of left foot; Hallux limitus, left; Gestational htn w/o significant proteinuria, unsp trimester; Supervision of other normal pregnancy, antepartum; Hypothyroidism; Advanced maternal age in multigravida, third trimester; and Abnormal chromosomal and genetic finding on antenatal screening mother on their problem list.  Patient reports no complaints.  Contractions: Not present. Vag. Bleeding: None.  Movement: Present. Denies leaking of fluid.   The following portions of the patient's history were reviewed and updated as appropriate: allergies, current medications, past family history, past medical history, past social history, past surgical history and problem list.   Objective:   Vitals:   07/28/21 1408  BP: 112/74  Pulse: 97  Weight: 165 lb (74.8 kg)    Fetal Status: Fetal Heart Rate (bpm): 154 Fundal Height: 35 cm Movement: Present     General:  Alert, oriented and cooperative. Patient is in no acute distress.  Skin: Skin is warm and dry. No rash noted.   Cardiovascular: Normal heart rate noted  Respiratory: Normal respiratory effort, no problems with respiration noted  Abdomen: Soft, gravid, appropriate for gestational age.  Pain/Pressure: Present     Pelvic: Cervical exam deferred        Extremities: Normal range of motion.  Edema: Trace  Mental Status: Normal mood and affect. Normal behavior. Normal judgment and thought content.   Assessment and Plan:  Pregnancy: G5P1031 at [redacted]w[redacted]d 1. Supervision of other normal pregnancy, antepartum --Anticipatory guidance about next visits/weeks of pregnancy  given. --Reviewed some items on pt birth plan verbally, desires no IV, delayed cord clamping.  Discussed options. Pt to bring written birth plan next visit. --No barriers to waterbirth at this time, needs consent next visit.  2. Hypothyroidism, unspecified type --Pt will wait and do labs next visit.  - TSH - T4, free - T3, free  Preterm labor symptoms and general obstetric precautions including but not limited to vaginal bleeding, contractions, leaking of fluid and fetal movement were reviewed in detail with the patient. Please refer to After Visit Summary for other counseling recommendations.   No follow-ups on file.  Future Appointments  Date Time Provider Department Center  08/11/2021  2:30 PM Leftwich-Kirby, Wilmer Floor, CNM [facility]    Sharen Counter, CNM

## 2021-07-28 NOTE — Progress Notes (Signed)
Pt c/o cough

## 2021-08-11 ENCOUNTER — Ambulatory Visit (INDEPENDENT_AMBULATORY_CARE_PROVIDER_SITE_OTHER): Payer: BC Managed Care – PPO | Admitting: Advanced Practice Midwife

## 2021-08-11 ENCOUNTER — Other Ambulatory Visit (HOSPITAL_COMMUNITY)
Admission: RE | Admit: 2021-08-11 | Discharge: 2021-08-11 | Disposition: A | Discharge status: Home / Self Care | Payer: BC Managed Care – PPO | Source: Ambulatory Visit | Attending: Advanced Practice Midwife | Admitting: Advanced Practice Midwife

## 2021-08-11 ENCOUNTER — Other Ambulatory Visit: Payer: Self-pay

## 2021-08-11 VITALS — BP 132/78 | HR 102 | Wt 169.0 lb

## 2021-08-11 DIAGNOSIS — O285 Abnormal chromosomal and genetic finding on antenatal screening of mother: Secondary | ICD-10-CM

## 2021-08-11 DIAGNOSIS — Z3480 Encounter for supervision of other normal pregnancy, unspecified trimester: Secondary | ICD-10-CM | POA: Diagnosis not present

## 2021-08-11 DIAGNOSIS — O09523 Supervision of elderly multigravida, third trimester: Secondary | ICD-10-CM | POA: Diagnosis not present

## 2021-08-11 DIAGNOSIS — E039 Hypothyroidism, unspecified: Secondary | ICD-10-CM

## 2021-08-11 DIAGNOSIS — O99283 Endocrine, nutritional and metabolic diseases complicating pregnancy, third trimester: Secondary | ICD-10-CM | POA: Diagnosis not present

## 2021-08-11 DIAGNOSIS — Z3A36 36 weeks gestation of pregnancy: Secondary | ICD-10-CM

## 2021-08-11 NOTE — Progress Notes (Signed)
Pt requested self swab for GBS Pt declined Gc/Ch testing

## 2021-08-11 NOTE — Progress Notes (Signed)
° °  PRENATAL VISIT NOTE  Subjective:  Cassandra Kennedy is a 40 y.o. G5P1031 at [redacted]w[redacted]d being seen today for ongoing prenatal care.  She is currently monitored for the following issues for this low-risk pregnancy and has Acquired posterior equinus, left; Congenital pes planus of left foot; Congenital pes planus of right foot; Female infertility; In vitro fertilization; Tarsal coalition of left foot; Hallux limitus, left; Gestational htn w/o significant proteinuria, unsp trimester; Supervision of other normal pregnancy, antepartum; Hypothyroidism; Advanced maternal age in multigravida, third trimester; and Abnormal chromosomal and genetic finding on antenatal screening mother on their problem list.  Patient reports no complaints.  Contractions: Irritability. Vag. Bleeding: None.  Movement: Present. Denies leaking of fluid.   The following portions of the patient's history were reviewed and updated as appropriate: allergies, current medications, past family history, past medical history, past social history, past surgical history and problem list.   Objective:   Vitals:   08/11/21 1442  BP: 132/78  Pulse: (!) 102  Weight: 169 lb (76.7 kg)    Fetal Status: Fetal Heart Rate (bpm): 156 Fundal Height: 36 cm Movement: Present  Presentation: Vertex  General:  Alert, oriented and cooperative. Patient is in no acute distress.  Skin: Skin is warm and dry. No rash noted.   Cardiovascular: Normal heart rate noted  Respiratory: Normal respiratory effort, no problems with respiration noted  Abdomen: Soft, gravid, appropriate for gestational age.  Pain/Pressure: Present     Pelvic: Cervical exam deferred        Extremities: Normal range of motion.  Edema: Trace  Mental Status: Normal mood and affect. Normal behavior. Normal judgment and thought content.   Assessment and Plan:  Pregnancy: G5P1031 at [redacted]w[redacted]d 1. Supervision of other normal pregnancy, antepartum --Anticipatory guidance about next visits/weeks of  pregnancy given. --No barriers to waterbirth at this time. Consent not signed at today's visit. --Pt initially declined GCC but discussed and test run from urine today. Pt was negative at initial testing at [facility] and  has not had intercourse in the pregnancy. --Vertex by [place]  - Culture, beta strep (group b only) - GC/Chlamydia probe amp ([place])not at [facility]  2. Hypothyroidism, unspecified type --TSH, free T3, free T4 today  3. Abnormal chromosomal and genetic finding on antenatal screening mother --First trim screen pos for T21, no NIPS or amnio.  --Pt declined growth [place], fundal height wnl  4. [redacted] weeks gestation of pregnancy   Preterm labor symptoms and general obstetric precautions including but not limited to vaginal bleeding, contractions, leaking of fluid and fetal movement were reviewed in detail with the patient. Please refer to After Visit Summary for other counseling recommendations.   Return in about 2 weeks (around 08/25/2021).  Future Appointments  Date Time Provider Department Center  08/25/2021  2:30 PM Leftwich-Kirby, Wilmer Floor, CNM [facility]    Sharen Counter, CNM

## 2021-08-11 NOTE — Patient Instructions (Signed)
Things to Try After 37 weeks to Encourage Labor/Get Ready for Labor:    Try the Miles Circuit at www.milescircuit.com daily to improve baby's position and encourage the onset of labor.  Walk a little and rest a little every day.  Change positions often.  Cervical Ripening: May try one or both Red Raspberry Leaf capsules or tea:  two 300mg or 400mg tablets with each meal, 2-3 times a day, or 1-3 cups of tea daily  Potential Side Effects Of Raspberry Leaf:  Most women do not experience any side effects from drinking raspberry leaf tea. However, nausea and loose stools are possible   Evening Primrose Oil capsules: take 1 capsule by mouth and place one capsule in the vagina every night.    Some of the potential side effects:  Upset stomach  Loose stools or diarrhea  Headaches  Nausea  Sex can also help the cervix ripen and encourage labor onset.    Labor Precautions Reasons to come to [facility] at [facility]:  1.  Contractions are  5 minutes apart or less, each last 1 minute, these have been going on for 1-2 hours, and you cannot walk or talk during them 2.  You have a large gush of fluid, or a trickle of fluid that will not stop and you have to wear a pad 3.  You have bleeding that is bright red, heavier than spotting--like menstrual bleeding (spotting can be normal in early labor or after a check of your cervix) 4.  You do not feel the baby moving like he/she normally does  

## 2021-08-12 LAB — GC/CHLAMYDIA PROBE AMP (~~LOC~~) NOT AT ARMC
Chlamydia: NEGATIVE
Comment: NEGATIVE
Comment: NORMAL
Neisseria Gonorrhea: NEGATIVE

## 2021-08-12 LAB — TSH: TSH: 0.8 mIU/L

## 2021-08-12 LAB — T3, FREE: T3, Free: 2.7 pg/mL (ref 2.3–4.2)

## 2021-08-12 LAB — T4, FREE: Free T4: 1.1 ng/dL (ref 0.8–1.8)

## 2021-08-14 LAB — CULTURE, BETA STREP (GROUP B ONLY)
MICRO NUMBER:: 12801917
SPECIMEN QUALITY:: ADEQUATE

## 2021-08-25 ENCOUNTER — Encounter: Payer: BC Managed Care – PPO | Admitting: Advanced Practice Midwife

## 2021-08-30 IMAGING — US US BREAST*L* LIMITED INC AXILLA
1 series · 2 of 2 positions shown · non-contrast
Comparison: None.

CLINICAL DATA: Palpable thickening deep in the central left breast
on recent physical examination. The patient reports that she has
chronic thickening in that area and that it is more prominent prior
to her menstrual periods. Her recent physical examination was prior
to her menstrual period. No family history of breast cancer.

EXAM:
DIGITAL DIAGNOSTIC BILATERAL MAMMOGRAM WITH CAD AND TOMO
ULTRASOUND LEFT BREAST

[Series 1: us breast*left* limited inc axilla · 0.07mm/px · 2 of 2 slices shown]
[im 1/2]
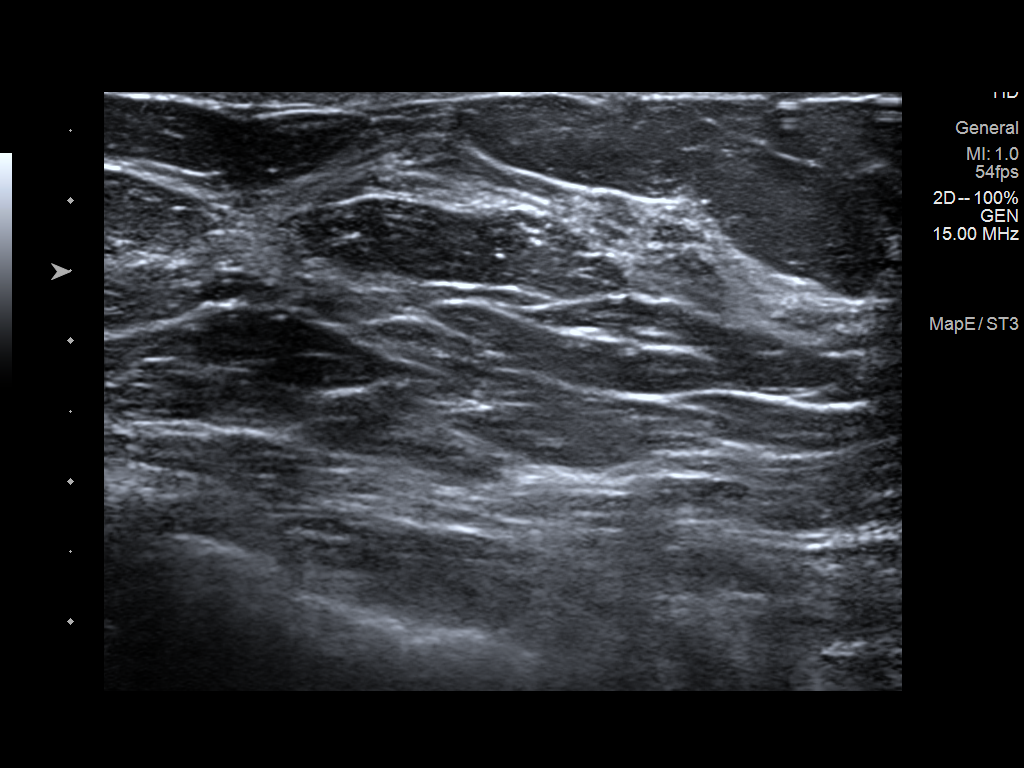
[im 2/2]
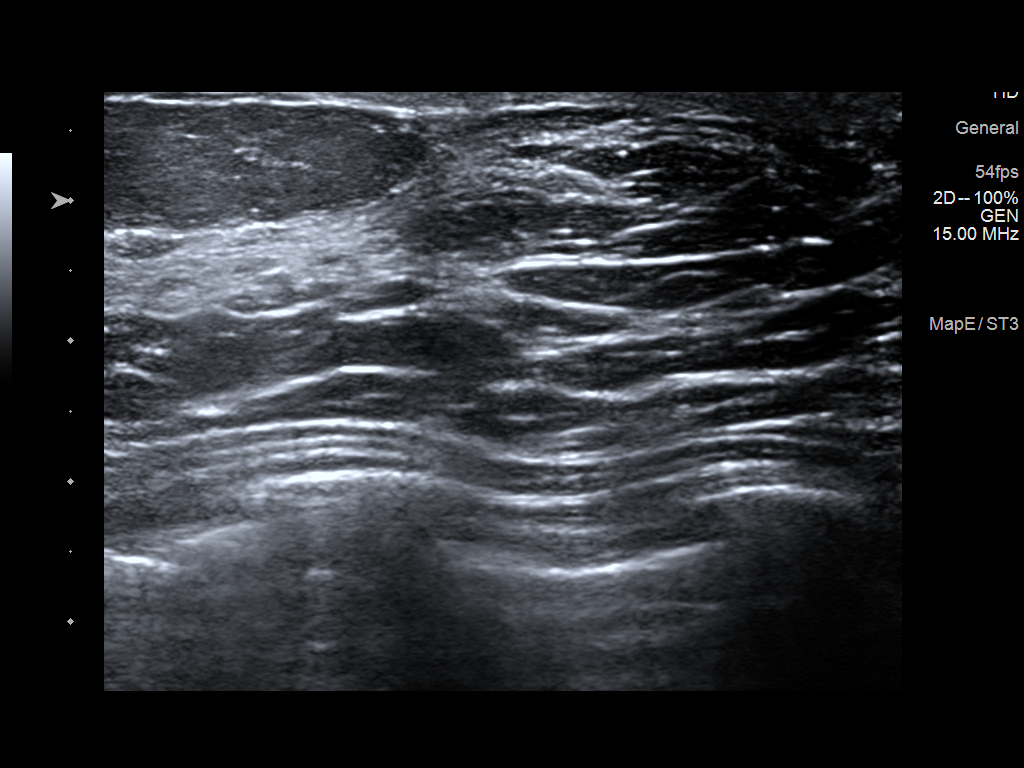

[2 of 2 positions shown; findings below may reference images not displayed]

Baseline.

ACR Breast Density Category b: There are scattered areas of
fibroglandular density.
FINDINGS: Mammographically normal appearing breasts no findings suspicious for
malignancy in either breast.

Mammographic images were processed with CAD.

On physical exam, the patient has mild horizontally oriented
palpable soft tissue thickening in the 11-2 o'clock position of the
left breast above the level of the nipple. There is no discrete
palpable mass.

Targeted ultrasound is performed, showing normal appearing breast
tissue throughout the upper left breast in the area palpable concern
indicated by the patient. This includes normal appearing glandular
tissue extending close to the skin, corresponding to the palpable
thickening.
IMPRESSION: No evidence of malignancy.

RECOMMENDATION:
Annual screening mammography beginning at age 40.

I have discussed the findings and recommendations with the patient.
If applicable, a reminder letter will be sent to the patient
regarding the next appointment.

BI-RADS CATEGORY  1: Negative.

## 2021-09-03 ENCOUNTER — Inpatient Hospital Stay (HOSPITAL_COMMUNITY): Admit: 2021-09-03 | Payer: Self-pay

## 2021-11-12 DIAGNOSIS — Z13220 Encounter for screening for lipoid disorders: Secondary | ICD-10-CM | POA: Diagnosis not present

## 2021-11-12 DIAGNOSIS — Z0000 Encounter for general adult medical examination without abnormal findings: Secondary | ICD-10-CM | POA: Diagnosis not present

## 2021-11-19 DIAGNOSIS — E038 Other specified hypothyroidism: Secondary | ICD-10-CM | POA: Diagnosis not present

## 2022-02-26 DIAGNOSIS — E039 Hypothyroidism, unspecified: Secondary | ICD-10-CM | POA: Diagnosis not present

## 2022-06-01 ENCOUNTER — Telehealth: Payer: Self-pay | Admitting: *Deleted

## 2022-06-01 NOTE — Chronic Care Management (AMB) (Signed)
   [facility] [employer] is PCP per patient she will call and update insurance.

## 2022-06-01 NOTE — Chronic Care Management (AMB) (Signed)
  Care Coordination   Note   06/01/2022 Name: Cassandra Kennedy MRN: [id number] DOB: May 19, 1981  [name] is a 41 y.o. year old female who sees Pcp, No for primary care. I reached out to Concha Se by phone today to offer care coordination services.  [name] was given information about Care Coordination services today including:   The Care Coordination services include support from the care team which includes your Nurse Coordinator, Clinical Social Worker, or Pharmacist.  The Care Coordination team is here to help remove barriers to the health concerns and goals most important to you. Care Coordination services are voluntary, and the patient may decline or stop services at any time by request to their care team member.   Care Coordination Consent Status: Patient did not agree to participate in care coordination services at this time.    Encounter Outcome:  Pt. Refused  [place]  Direct Dial: [phone]

## 2024-02-02 NOTE — Telephone Encounter (Signed)
 ERROR
